# Patient Record
Sex: Female | Born: 1995 | Race: Black or African American | Hispanic: No | Marital: Single | Smoking: Former smoker
Health system: Southern US, Community
[De-identification: ages and names within clinical notes are randomized; demographics above are authoritative.]

## PROBLEM LIST (undated history)

## (undated) DIAGNOSIS — Z789 Other specified health status: Secondary | ICD-10-CM

## (undated) DIAGNOSIS — F32A Depression, unspecified: Secondary | ICD-10-CM

## (undated) DIAGNOSIS — K219 Gastro-esophageal reflux disease without esophagitis: Secondary | ICD-10-CM

## (undated) DIAGNOSIS — R519 Headache, unspecified: Secondary | ICD-10-CM

## (undated) DIAGNOSIS — F419 Anxiety disorder, unspecified: Secondary | ICD-10-CM

## (undated) HISTORY — PX: NO PAST SURGERIES: SHX2092

## (undated) HISTORY — DX: Other specified health status: Z78.9

---
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
## 2017-09-14 ENCOUNTER — Other Ambulatory Visit: Payer: Self-pay

## 2017-09-14 ENCOUNTER — Emergency Department
Admission: EM | Admit: 2017-09-14 | Discharge: 2017-09-14 | Disposition: A | Discharge status: Home / Self Care | Payer: Self-pay | Attending: Emergency Medicine | Admitting: Emergency Medicine

## 2017-09-14 ENCOUNTER — Encounter: Payer: Self-pay | Admitting: *Deleted

## 2017-09-14 DIAGNOSIS — R1084 Generalized abdominal pain: Secondary | ICD-10-CM | POA: Insufficient documentation

## 2017-09-14 DIAGNOSIS — K529 Noninfective gastroenteritis and colitis, unspecified: Secondary | ICD-10-CM

## 2017-09-14 DIAGNOSIS — R112 Nausea with vomiting, unspecified: Secondary | ICD-10-CM

## 2017-09-14 DIAGNOSIS — F17200 Nicotine dependence, unspecified, uncomplicated: Secondary | ICD-10-CM | POA: Insufficient documentation

## 2017-09-14 LAB — PREGNANCY, URINE: Preg Test, Ur: NEGATIVE

## 2017-09-14 LAB — URINALYSIS, COMPLETE (UACMP) WITH MICROSCOPIC
Bilirubin Urine: NEGATIVE
GLUCOSE, UA: NEGATIVE mg/dL
KETONES UR: NEGATIVE mg/dL
Leukocytes, UA: NEGATIVE
Nitrite: NEGATIVE
PH: 6 (ref 5.0–8.0)
Protein, ur: NEGATIVE mg/dL
Specific Gravity, Urine: 1.017 (ref 1.005–1.030)

## 2017-09-14 LAB — COMPREHENSIVE METABOLIC PANEL
ALK PHOS: 66 U/L (ref 38–126)
ALT: 18 U/L (ref 14–54)
AST: 27 U/L (ref 15–41)
Albumin: 4.1 g/dL (ref 3.5–5.0)
Anion gap: 9 (ref 5–15)
BILIRUBIN TOTAL: 0.9 mg/dL (ref 0.3–1.2)
BUN: 12 mg/dL (ref 6–20)
CALCIUM: 9.2 mg/dL (ref 8.9–10.3)
CO2: 26 mmol/L (ref 22–32)
CREATININE: 0.91 mg/dL (ref 0.44–1.00)
Chloride: 106 mmol/L (ref 101–111)
GFR calc Af Amer: >60 mL/min (ref >60)
Glucose, Bld: 90 mg/dL (ref 65–99)
Potassium: 3.7 mmol/L (ref 3.5–5.1)
Sodium: 141 mmol/L (ref 135–145)
TOTAL PROTEIN: 7.9 g/dL (ref 6.5–8.1)

## 2017-09-14 LAB — LIPASE, BLOOD: Lipase: 18 U/L (ref 11–51)

## 2017-09-14 LAB — CBC
HCT: 38.2 % (ref 35.0–47.0)
Hemoglobin: 12.7 g/dL (ref 12.0–16.0)
MCH: 30.7 pg (ref 26.0–34.0)
MCHC: 33.2 g/dL (ref 32.0–36.0)
MCV: 92.4 fL (ref 80.0–100.0)
PLATELETS: 305 10*3/uL (ref 150–440)
RBC: 4.14 MIL/uL (ref 3.80–5.20)
RDW: 13.7 % (ref 11.5–14.5)
WBC: 13.4 10*3/uL — AB (ref 3.6–11.0)

## 2017-09-14 LAB — POCT PREGNANCY, URINE: Preg Test, Ur: NEGATIVE

## 2017-09-14 MED ORDER — ONDANSETRON 4 MG PO TBDP
ORAL_TABLET | ORAL | Status: AC
  Filled 2017-09-14: qty 1

## 2017-09-14 MED ORDER — ONDANSETRON HCL 4 MG PO TABS: 4.0000 mg | ORAL_TABLET | Freq: Three times a day (TID) | ORAL | 0 refills | Status: DC | PRN

## 2017-09-14 MED ORDER — DICYCLOMINE HCL 20 MG PO TABS: 20.0000 mg | ORAL_TABLET | Freq: Three times a day (TID) | ORAL | 0 refills | Status: DC | PRN

## 2017-09-14 MED ORDER — ONDANSETRON 4 MG PO TBDP
4.0000 mg | ORAL_TABLET | Freq: Once | ORAL | Status: AC | PRN
  Administered 2017-09-14: 4 mg via ORAL

## 2017-09-14 NOTE — ED Triage Notes (Signed)
Patient report vomiting began yesterday and continues today. Patient reports vomiting bile-colored emesis.

## 2017-09-14 NOTE — ED Provider Notes (Signed)
[facility] Emergency Department Provider Note  ____________________________________________   I have reviewed the triage vital signs and the nursing notes.   HISTORY  Chief Complaint Emesis and Abdominal Pain   History limited by: Not Limited   HPI Alyssa Woodard is a 22 y.o. female who presents to the emergency department today with concerns for abdominal pain nausea and vomiting.  The patient states the symptoms started last night.  They started shortly after she ate a couple hotdogs.  She describes the pain as being generalized throughout her abdomen.  It is sharp.  It does come and go.  In addition she has had multiple episodes of emesis.  She describes some green emesis.  She denies any diarrhea.  Denies any fevers.  At the time my examination she does states she feels much improved.  She did receive Zofran in triage.  History reviewed. No pertinent past medical history.  There are no active problems to display for this patient.   History reviewed. No pertinent surgical history.  Prior to Admission medications   Not on File    Allergies Patient has no known allergies.  No family history on file.  Social History Social History   Tobacco Use  . Smoking status: Current Some Day Smoker  . Smokeless tobacco: Never Used  Substance Use Topics  . Alcohol use: Yes    Comment: socially  . Drug use: Never    Review of Systems Constitutional: No fever/chills Eyes: No visual changes. ENT: No sore throat. Cardiovascular: Denies chest pain. Respiratory: Denies shortness of breath. Gastrointestinal: Generalized abdominal pain and nausea with vomiting. Genitourinary: Negative for dysuria. Musculoskeletal: Negative for back pain. Skin: Negative for rash. Neurological: Negative for headaches, focal weakness or numbness.  ____________________________________________   PHYSICAL EXAM:  VITAL SIGNS: ED Triage Vitals  Enc Vitals Group     BP  09/14/17 1515 (!) 146/76     Pulse Rate 09/14/17 1515 (!) 106     Resp 09/14/17 1515 18     Temp 09/14/17 1515 98.9 F (37.2 C)     Temp Source 09/14/17 1515 Oral     SpO2 09/14/17 1515 99 %     Weight 09/14/17 1517 (!) 408 lb 15.3 oz (185.5 kg)     Height 09/14/17 1517 5\' 1"  (1.549 m)     Head Circumference --      Peak Flow --      Pain Score 09/14/17 1517 8    Constitutional: Alert and oriented. Well appearing and in no distress. Eyes: Conjunctivae are normal.  ENT   Head: Normocephalic and atraumatic.   Nose: No congestion/rhinnorhea.   Mouth/Throat: Mucous membranes are moist.   Neck: No stridor. Hematological/Lymphatic/Immunilogical: No cervical lymphadenopathy. Cardiovascular: Normal rate, regular rhythm.  No murmurs, rubs, or gallops appreciated, exam somewhat limited to body habitus.  Respiratory: Normal respiratory effort without tachypnea nor retractions. Auscultation limited due to body habitus. Gastrointestinal: Soft and somewhat diffusely tender to palpation. No rebound. No guarding.  Genitourinary: Deferred Musculoskeletal: Normal range of motion in all extremities. No lower extremity edema. Neurologic:  Normal speech and language. No gross focal neurologic deficits are appreciated.  Skin:  Skin is warm, dry and intact. No rash noted. Psychiatric: Mood and affect are normal. Speech and behavior are normal. Patient exhibits appropriate insight and judgment.  ____________________________________________    LABS (pertinent positives/negatives)  Upreg negative Lipase 18 CMP wnl CBC wbc 13.4, hgb 12.7, plt 305 UA cloudy, leukocytes negative, 6-30 wbcs, rare  bacteria 6-30 squamous  ____________________________________________   EKG  None  ____________________________________________    RADIOLOGY  None  ____________________________________________   PROCEDURES  Procedures  ____________________________________________   INITIAL  IMPRESSION / ASSESSMENT AND PLAN / ED COURSE  Pertinent labs & imaging results that were available during my care of the patient were reviewed by me and considered in my medical decision making (see chart for details).  Patient presented to the emergency department today because of concerns for abdominal pain, nausea and vomiting.  At time of exam she is feeling better after Zofran.  Blood work with very mild leukocytosis otherwise unremarkable.  Urine with some white blood cells however patient not complaining of any dysuria.  At this point I do think gastroenteritis versus food poisoning most likely.  I doubt gallbladder, appendicitis, diverticulitis, volvulus or other significant intra-abdominal pathology.  I did offer to give patient further medications to try to alleviate some of her abdominal pain however she declined stating she would rather go home.  Will give patient prescriptions for gastroenteritis. Discussed findings and plan with patient.  ____________________________________________   FINAL CLINICAL IMPRESSION(S) / ED DIAGNOSES  Final diagnoses:  Gastroenteritis  Nausea and vomiting, intractability of vomiting not specified, unspecified vomiting type     Note: This dictation was prepared with Dragon dictation. Any transcriptional errors that result from this process are unintentional    Phineas Semen, MD 09/14/17 1732

## 2017-09-14 NOTE — Discharge Instructions (Addendum)
Please seek medical attention for any high fevers, chest pain, shortness of breath, change in behavior, persistent vomiting, bloody stool or any other new or concerning symptoms.  

## 2017-09-16 LAB — URINE CULTURE

## 2019-04-30 ENCOUNTER — Encounter (HOSPITAL_COMMUNITY): Payer: Self-pay

## 2019-04-30 ENCOUNTER — Ambulatory Visit (HOSPITAL_COMMUNITY)
Admission: EM | Admit: 2019-04-30 | Discharge: 2019-04-30 | Disposition: A | Discharge status: Home / Self Care | Payer: Medicaid Other | Attending: Internal Medicine | Admitting: Internal Medicine

## 2019-04-30 ENCOUNTER — Other Ambulatory Visit: Payer: Self-pay

## 2019-04-30 DIAGNOSIS — Z3201 Encounter for pregnancy test, result positive: Secondary | ICD-10-CM

## 2019-04-30 DIAGNOSIS — Z3491 Encounter for supervision of normal pregnancy, unspecified, first trimester: Secondary | ICD-10-CM

## 2019-04-30 DIAGNOSIS — R1030 Lower abdominal pain, unspecified: Secondary | ICD-10-CM | POA: Diagnosis not present

## 2019-04-30 LAB — POC URINE PREG, ED: Preg Test, Ur: POSITIVE — AB

## 2019-04-30 LAB — POCT PREGNANCY, URINE: Preg Test, Ur: POSITIVE — AB

## 2019-04-30 MED ORDER — PRENATAL COMPLETE 14-0.4 MG PO TABS: 1.0000 | ORAL_TABLET | Freq: Every day | ORAL | 0 refills | Status: DC

## 2019-04-30 NOTE — Discharge Instructions (Signed)
Positive pregnancy test today. Based on a last period of 03/20/2019 you are approximately 5 weeks 6 days pregnant with an estimated due date of 12/25/2019.  Please start a daily prenatal vitamin.  Avoid alcohol or tobacco products.  Please call to establish care with an ob, typically first appointments are anywhere from 8-12 weeks.  See provided list of medications which are safe during pregnancy.  See provided information about nausea and pregnancy.  Please go to [facility] hospital for any abdominal pain and/or vaginal bleeding.

## 2019-04-30 NOTE — ED Provider Notes (Signed)
[facility]    CSN: [id number] Arrival date & time: 04/30/19  7001      History   Chief Complaint Chief Complaint  Patient presents with  . pregnancy test    HPI Alyssa Woodard is a 23 y.o. female.   Alyssa Woodard presents with requests for pregnancy validation. Took a home pregnancy test which was positive. Some intermittent low abd cramping. No vaginal bleeding. LMP 10/10. No previous pregnancies. Doesn't smoke. Doesn't take any medications. No complaints today. Without contributing medical history.       ROS per HPI, negative if not otherwise mentioned.      History reviewed. No pertinent past medical history.  There are no active problems to display for this patient.   History reviewed. No pertinent surgical history.  OB History   No obstetric history on file.      Home Medications    Prior to Admission medications   Medication Sig Start Date End Date Taking? Authorizing Provider  dicyclomine (BENTYL) 20 MG tablet Take 1 tablet (20 mg total) by mouth 3 (three) times daily as needed (abdominal pain). 09/14/17   Phineas Semen, MD  ondansetron (ZOFRAN) 4 MG tablet Take 1 tablet (4 mg total) by mouth every 8 (eight) hours as needed for nausea or vomiting. 09/14/17   Phineas Semen, MD  Prenatal Vit-Fe Fumarate-FA (PRENATAL COMPLETE) 14-0.4 MG TABS Take 1 tablet by mouth daily. 04/30/19   Georgetta Haber, NP    Family History Family History  Problem Relation Age of Onset  . Healthy Mother   . Healthy Father     Social History Social History   Tobacco Use  . Smoking status: Former Smoker    Types: Cigarettes  . Smokeless tobacco: Never Used  . Tobacco comment: none since 04/22/2019  Substance Use Topics  . Alcohol use: Not Currently    Comment: socially  . Drug use: Not Currently     Allergies   Patient has no known allergies.   Review of Systems Review of Systems   Physical Exam Triage Vital Signs ED Triage Vitals   Enc Vitals Group     BP 04/30/19 1046 128/74     Pulse Rate 04/30/19 1046 77     Resp 04/30/19 1046 18     Temp 04/30/19 1046 98.1 F (36.7 C)     Temp Source 04/30/19 1046 Oral     SpO2 04/30/19 1046 99 %     Weight --      Height --      Head Circumference --      Peak Flow --      Pain Score 04/30/19 1051 0     Pain Loc --      Pain Edu? --      Excl. in GC? --    No data found.  Updated Vital Signs BP 128/74 (BP Location: Left Arm)   Pulse 77   Temp 98.1 F (36.7 C) (Oral)   Resp 18   LMP 03/20/2019   SpO2 99%   Visual Acuity Right Eye Distance:   Left Eye Distance:   Bilateral Distance:    Right Eye Near:   Left Eye Near:    Bilateral Near:     Physical Exam Constitutional:      General: She is not in acute distress.    Appearance: She is well-developed.  Cardiovascular:     Rate and Rhythm: Normal rate.  Pulmonary:     Effort: Pulmonary  effort is normal.  Skin:    General: Skin is warm and dry.  Neurological:     Mental Status: She is alert and oriented to person, place, and time.      UC Treatments / Results  Labs (all labs ordered are listed, but only abnormal results are displayed) Labs Reviewed  POCT PREGNANCY, URINE - Abnormal; Notable for the following components:      Result Value   Preg Test, Ur POSITIVE (*)    All other components within normal limits  POC URINE PREG, ED - Abnormal; Notable for the following components:   Preg Test, Ur POSITIVE (*)    All other components within normal limits    EKG   Radiology No results found.  Procedures Procedures (including critical care time)  Medications Ordered in UC Medications - No data to display  Initial Impression / Assessment and Plan / UC Course  I have reviewed the triage vital signs and the nursing notes.  Pertinent labs & imaging results that were available during my care of the patient were reviewed by me and considered in my medical decision making (see chart for  details).     Positive pregnancy test by urine here today. Approximately 5 week 6 days per LMP. Prenatals prescribed, initial prenatal recommendations discussed. Encouraged establish with OB. Patient verbalized understanding and agreeable to plan.    Final Clinical Impressions(s) / UC Diagnoses   Final diagnoses:  First trimester pregnancy  Positive pregnancy test     Discharge Instructions     Positive pregnancy test today. Based on a last period of 03/20/2019 you are approximately 5 weeks 6 days pregnant with an estimated due date of 12/25/2019.  Please start a daily prenatal vitamin.  Avoid alcohol or tobacco products.  Please call to establish care with an ob, typically first appointments are anywhere from 8-12 weeks.  See provided list of medications which are safe during pregnancy.  See provided information about nausea and pregnancy.  Please go to [facility] hospital for any abdominal pain and/or vaginal bleeding.    ED Prescriptions    Medication Sig Dispense Auth. Provider   Prenatal Vit-Fe Fumarate-FA (PRENATAL COMPLETE) 14-0.4 MG TABS Take 1 tablet by mouth daily. 60 tablet Zigmund Gottron, NP     PDMP not reviewed this encounter.   Zigmund Gottron, NP 04/30/19 1218

## 2019-04-30 NOTE — ED Triage Notes (Signed)
Pt presents to UC stating she took a pregnancy test on 11/12 which seemed positive. Pt states she took the test d/t feeling nauseous. She would like a confirmation test here.

## 2019-05-24 ENCOUNTER — Other Ambulatory Visit (HOSPITAL_COMMUNITY): Payer: Self-pay | Admitting: Nurse Practitioner

## 2019-05-24 DIAGNOSIS — Z369 Encounter for antenatal screening, unspecified: Secondary | ICD-10-CM

## 2019-05-24 DIAGNOSIS — Z3A13 13 weeks gestation of pregnancy: Secondary | ICD-10-CM

## 2019-05-24 LAB — OB RESULTS CONSOLE RUBELLA ANTIBODY, IGM: Rubella: IMMUNE

## 2019-05-24 LAB — OB RESULTS CONSOLE GC/CHLAMYDIA
Chlamydia: NEGATIVE
Gonorrhea: NEGATIVE

## 2019-05-24 LAB — OB RESULTS CONSOLE HIV ANTIBODY (ROUTINE TESTING): HIV: NONREACTIVE

## 2019-05-24 LAB — OB RESULTS CONSOLE HEPATITIS B SURFACE ANTIGEN: Hepatitis B Surface Ag: NEGATIVE

## 2019-05-24 LAB — OB RESULTS CONSOLE ABO/RH: RH Type: POSITIVE

## 2019-05-24 LAB — OB RESULTS CONSOLE RPR: RPR: NONREACTIVE

## 2019-05-24 LAB — OB RESULTS CONSOLE ANTIBODY SCREEN: Antibody Screen: NEGATIVE

## 2019-06-16 ENCOUNTER — Encounter (HOSPITAL_COMMUNITY): Payer: Self-pay | Admitting: *Deleted

## 2019-06-21 ENCOUNTER — Ambulatory Visit (HOSPITAL_COMMUNITY)
Admission: RE | Admit: 2019-06-21 | Discharge: 2019-06-21 | Disposition: A | Discharge status: Home / Self Care | Payer: Medicaid Other | Source: Ambulatory Visit | Attending: Obstetrics and Gynecology | Admitting: Obstetrics and Gynecology

## 2019-06-21 ENCOUNTER — Other Ambulatory Visit: Payer: Self-pay

## 2019-06-21 ENCOUNTER — Ambulatory Visit (HOSPITAL_BASED_OUTPATIENT_CLINIC_OR_DEPARTMENT_OTHER): Payer: Medicaid Other | Admitting: Genetic Counselor

## 2019-06-21 ENCOUNTER — Ambulatory Visit (HOSPITAL_COMMUNITY): Payer: Medicaid Other | Admitting: *Deleted

## 2019-06-21 ENCOUNTER — Encounter (HOSPITAL_COMMUNITY): Payer: Self-pay

## 2019-06-21 ENCOUNTER — Other Ambulatory Visit (HOSPITAL_COMMUNITY): Payer: Self-pay | Admitting: *Deleted

## 2019-06-21 ENCOUNTER — Other Ambulatory Visit (HOSPITAL_COMMUNITY): Payer: Self-pay | Admitting: Nurse Practitioner

## 2019-06-21 ENCOUNTER — Ambulatory Visit (HOSPITAL_COMMUNITY): Payer: Self-pay | Admitting: Genetic Counselor

## 2019-06-21 VITALS — BP 113/60 | HR 88 | Temp 96.6°F | Wt >= 6400 oz

## 2019-06-21 DIAGNOSIS — Z3682 Encounter for antenatal screening for nuchal translucency: Secondary | ICD-10-CM | POA: Diagnosis not present

## 2019-06-21 DIAGNOSIS — O99212 Obesity complicating pregnancy, second trimester: Secondary | ICD-10-CM | POA: Insufficient documentation

## 2019-06-21 DIAGNOSIS — Z369 Encounter for antenatal screening, unspecified: Secondary | ICD-10-CM

## 2019-06-21 DIAGNOSIS — Z363 Encounter for antenatal screening for malformations: Secondary | ICD-10-CM | POA: Diagnosis not present

## 2019-06-21 DIAGNOSIS — Z3A15 15 weeks gestation of pregnancy: Secondary | ICD-10-CM | POA: Insufficient documentation

## 2019-06-21 DIAGNOSIS — Z3A13 13 weeks gestation of pregnancy: Secondary | ICD-10-CM

## 2019-06-21 DIAGNOSIS — F819 Developmental disorder of scholastic skills, unspecified: Secondary | ICD-10-CM

## 2019-06-21 DIAGNOSIS — Z36 Encounter for antenatal screening for chromosomal anomalies: Secondary | ICD-10-CM | POA: Insufficient documentation

## 2019-06-21 DIAGNOSIS — E669 Obesity, unspecified: Secondary | ICD-10-CM | POA: Diagnosis not present

## 2019-06-21 DIAGNOSIS — O0990 Supervision of high risk pregnancy, unspecified, unspecified trimester: Secondary | ICD-10-CM

## 2019-06-21 DIAGNOSIS — O99342 Other mental disorders complicating pregnancy, second trimester: Secondary | ICD-10-CM | POA: Diagnosis not present

## 2019-06-21 DIAGNOSIS — Z315 Encounter for genetic counseling: Secondary | ICD-10-CM | POA: Diagnosis not present

## 2019-06-21 NOTE — Progress Notes (Signed)
06/21/2019  Lamisha N Dipiero [birth year] MRN: [id number] DOV: 06/21/2019  Ms. Sanzo presented to the [facility] for a genetics consultation regarding screening for chromosomal aneuploidies. Ms. Ergle came to her appointment alone due to COVID-19 visitor restrictions. However, her mother participated in the session via FaceTime.   Indication for genetic counseling - Screening for chromosomal aneuploidies  Prenatal history  Ms. Willmon is a G75P0, 24 y.o. female. Her current pregnancy has completed [redacted]w[redacted]d (Estimated Date of Delivery: 12/08/19).  Ms. Crist denied exposure to environmental toxins or chemical agents. She denied the use of alcohol or street drugs. She was a former smoker but quit smoking cigarettes on 04/22/19. She reported taking prenatal vitamins and Tylenol. She denied significant viral illnesses, fevers, and bleeding during the course of her pregnancy. Her medical and surgical histories were noncontributory.  Family History  A three generation pedigree was drafted and reviewed. The family history is remarkable for the following:  - Ms. Youtsey has a personal history of a learning disability. She was pulled out of classes to receive services. She graduated high school but is not employed. Per her mother, she was not delayed in achieving any of her speech or motor milestones. Ms. Torosian also has a maternal half brother who is 61 years old and does not speak. He communicates via grunting. He reportedly has a learning disability and autism. Ms. Kleeman brother also has a paternal uncle with autism, who is not related to Ms. Loscalzo by blood. Ms. Sherbert mother also reported a personal history of learning difficulties in school. She got pulled out of classes for services. We discussed that many times, learning difficulties and developmental delays are multifactorial in nature, occurring due to a combination of genetic and environmental factors that are difficult  to identify. Learning disabilities and developmental delays can also be associated with genetic conditions. Without further information, precise risk assessment is limited; however, learning disabilities and developmental delays can appear to run in families. Thus, there is a chance that Ms. Grave's children could also experience learning difficulties of some kind. She understands that she should make the pediatrician aware of any concerns she has about her children's development.  - Ms. Baisch's partner, Craig Staggers, was born with a congenital heart defect (CHD) that required surgery at age 40. Details about the etiology of this CHD are not available; however, Ms. Alia mother is going to discuss this with Mr. Durenda Age mother in greater detail. There are multifactorial causes for isolated CHDs, including environmental and genetic factors. As such, the recurrence risk for first degree relatives is elevated over the general population incidence of 1/200 (0.5%). We discussed that without knowing the exact etiology, the risk for a child of an affected father is approximately 2-3%. A fetal echocardiogram to assess for CHDs in the current pregnancy is recommended.  The remaining family histories were reviewed and found to be noncontributory for birth defects, intellectual disability, recurrent pregnancy loss, and known genetic conditions. Ms. Galgano had limited information about her partner's family history; thus, risk assessment was limited.  The patient's ethnicity is African American. The father of the pregnancy's ethnicity is African American. Ashkenazi Jewish ancestry and consanguinity were denied. Pedigree will be scanned under Media.  Discussion  Ms. Gotschall was referred to genetic counseling as she was interested in discussing aneuploidy screening options for the current pregnancy. We reviewed noninvasive prenatal screening (NIPS) as an available screening option. Specifically, we discussed that  NIPS analyzes cell free DNA  originating from the placenta that is found in the maternal blood circulation during pregnancy. This test can provide information regarding the presence or absence of extra fetal DNA for chromosomes 13, 18, 21, and the sex chromosomes. Thus, it would not identify or rule out all fetal aneuploidy. The reported detection rate is 91-99% for trisomies 21, 18, 13, and sex chromosome aneuploidies. The false positive rate is reported to be less than 0.1% for any of these conditions.   We discussed that there are several different types of results we could receive from NIPS. The first is a negative, or normal result. This indicates that the fetus has a reduced risk of being affected by trisomy 69 (Down syndrome), trisomy 37, trisomy 95, or sex chromosome aneuploidies. This is the most likely result given Ms. Grave's age. The second type is a positive result. This indicates that there is a chance that the fetus could have a chromosomal aneuploidy. The third type is an unexpected result. For example, sometimes NIPS fails due to low fetal fraction. The term "fetal fraction" refers to the amount of sample that is believed to have come from fetal DNA rather than maternal DNA. We reviewed that there are many possible reasons a sample may have a low fetal fraction, including early gestational age, high maternal BMI, suboptimal sample collection, maternal use of medications like low molecular weight heparin, pregnancy loss, pregnancy complications, fetal aneuploidy, and normal variation. If we were to receive a no-call result from NIPS, the test could potentially be repeated.  We also reviewed limitations of NIPS. Firstly, NIPS cannot screen for all possible genetic conditions or disabilities that a child could potentially have. Secondly, NIPS is NOT diagnostic. While this testing identifies the majority of pregnancies with trisomy 45, trisomy 16, trisomy 6, and sex chromosome aneuploidies, a  positive test result requires confirmation by CVS or amniocentesis, and a negative test result does not rule out a fetal chromosome abnormality. Ms. Neuberger indicated that she is interested in undergoing NIPS. She indicated that she would like to know ahead of time if her baby were expected to have any problems that could affect their health or development.  A limited early ultrasound was performed today prior to our visit. The ultrasound report will be sent under separate cover. There were no visualized fetal anomalies or markers suggestive of aneuploidy. Nuchal translucency measurement could not be obtained due to Ms. Hollomon's gestational age being more advanced than anticipated.  Ms. Joswick was also counseled regarding diagnostic testing via amniocentesis. We discussed the technical aspects of the procedure and quoted up to a 1 in 500 (0.2%) risk for spontaneous pregnancy loss or other adverse pregnancy outcomes as a result of amniocentesis. Cultured cells from an amniocentesis sample allow for the visualization of a fetal karyotype, which can detect >99% of chromosomal aberrations. Chromosomal microarray can also be performed to identify smaller deletions or duplications of fetal chromosomal material. After careful consideration, Ms. Heinkel declined amniocentesis. She understands that amniocentesis is available at any point after 16 weeks of pregnancy and that she may opt to undergo the procedure at a later date should she change her mind.  Lastly, screening for open neural tube defects (ONTDs) via MS-AFP in the second trimester in addition to level II ultrasound examination is recommended. Level II ultrasound and MS-AFP are able to detect ONTDs with 90-95% sensitivity. However, normal results from any of the above options do not guarantee a normal baby, as 3-5% of newborns have some type of birth  defect, many of which are not prenatally diagnosable.  Ms. Fuster had her blood drawn for MaterniT21 NIPS  today. Results will take 5-7 days to be returned. I will call Ms. Butch when her results become available.  I counseled Ms. Murty regarding the above risks and available options. The approximate face-to-face time with the genetic counselor was 30 minutes.  In summary:  Discussed noninvasive prenatal screening (NIPS) for fetal aneuploidies  Opted to undergo MaterniT21 NIPS. We will follow results  Reviewed results of ultrasound  No fetal anomalies or markers seen  Offered additional testing and screening  Declined diagnostic testing  Reviewed family history concerns  Recommend fetal echo due to FOB's history of a congenital heart defect   Gershon Crane, MS Genetic Counselor

## 2019-06-27 LAB — MATERNIT21 PLUS CORE+SCA
Fetal Fraction: 10
Monosomy X (Turner Syndrome): NOT DETECTED
Result (T21): NEGATIVE
Trisomy 13 (Patau syndrome): NEGATIVE
Trisomy 18 (Edwards syndrome): NEGATIVE
Trisomy 21 (Down syndrome): NEGATIVE
XXX (Triple X Syndrome): NOT DETECTED
XXY (Klinefelter Syndrome): NOT DETECTED
XYY (Jacobs Syndrome): NOT DETECTED

## 2019-06-29 ENCOUNTER — Telehealth (HOSPITAL_COMMUNITY): Payer: Self-pay | Admitting: Genetic Counselor

## 2019-06-29 NOTE — Telephone Encounter (Signed)
I called Ms. Menna and her mother to discuss her negative noninvasive prenatal screening (NIPS)/cell free DNA (cfDNA) testing result. Specifically, Ms. Yerby had MaterniT21 testing through [facility]. These negative results demonstrated an expected representation of chromosome 68, 35, 39, and sex chromosome material, greatly reducing the likelihood of trisomies 12, 47, or 100 and sex chromosome aneuploidies for the pregnancy. Ms. Devins did not want to know the expected fetal sex; however, she requested that I tell her mother as they are planning a gender reveal party. Expected fetal sex is female.  NIPS analyzes placental (fetal) DNA in maternal circulation. NIPS is considered to be highly specific and sensitive, but is not considered to be a diagnostic test. We reviewed that this testing identifies the majority (91-99%) of pregnancies with trisomies 54, 40, and 59, as well as sex chromosome aneuploidies. However, it cannot rule out all possible genetic conditions or disabilities. Diagnostic testing via amniocentesis is available should she be interested in confirming this result. Ms. Kinderman and her mother confirmed that they had no questions about these results at this time.  Gershon Crane, MS Genetic Counselor

## 2019-07-26 ENCOUNTER — Ambulatory Visit (HOSPITAL_COMMUNITY): Payer: Medicaid Other | Admitting: *Deleted

## 2019-07-26 ENCOUNTER — Other Ambulatory Visit (HOSPITAL_COMMUNITY): Payer: Self-pay | Admitting: *Deleted

## 2019-07-26 ENCOUNTER — Encounter (HOSPITAL_COMMUNITY): Payer: Self-pay

## 2019-07-26 ENCOUNTER — Other Ambulatory Visit: Payer: Self-pay

## 2019-07-26 ENCOUNTER — Ambulatory Visit (HOSPITAL_COMMUNITY)
Admission: RE | Admit: 2019-07-26 | Discharge: 2019-07-26 | Disposition: A | Discharge status: Home / Self Care | Payer: Medicaid Other | Source: Ambulatory Visit | Attending: Obstetrics and Gynecology | Admitting: Obstetrics and Gynecology

## 2019-07-26 VITALS — BP 134/66 | HR 102 | Temp 98.6°F

## 2019-07-26 DIAGNOSIS — Z3A20 20 weeks gestation of pregnancy: Secondary | ICD-10-CM

## 2019-07-26 DIAGNOSIS — Z6841 Body Mass Index (BMI) 40.0 and over, adult: Secondary | ICD-10-CM

## 2019-07-26 DIAGNOSIS — O99212 Obesity complicating pregnancy, second trimester: Secondary | ICD-10-CM

## 2019-08-10 ENCOUNTER — Encounter: Payer: Self-pay | Admitting: Pediatrics

## 2019-08-23 ENCOUNTER — Ambulatory Visit (HOSPITAL_COMMUNITY)
Admission: RE | Admit: 2019-08-23 | Discharge: 2019-08-23 | Disposition: A | Discharge status: Home / Self Care | Payer: Medicaid Other | Source: Ambulatory Visit | Attending: Obstetrics and Gynecology | Admitting: Obstetrics and Gynecology

## 2019-08-23 ENCOUNTER — Other Ambulatory Visit (HOSPITAL_COMMUNITY): Payer: Self-pay | Admitting: *Deleted

## 2019-08-23 ENCOUNTER — Ambulatory Visit (HOSPITAL_COMMUNITY): Payer: Medicaid Other | Admitting: *Deleted

## 2019-08-23 ENCOUNTER — Encounter (HOSPITAL_COMMUNITY): Payer: Self-pay

## 2019-08-23 ENCOUNTER — Other Ambulatory Visit: Payer: Self-pay

## 2019-08-23 VITALS — BP 112/68 | HR 113 | Temp 98.8°F

## 2019-08-23 DIAGNOSIS — Z3A24 24 weeks gestation of pregnancy: Secondary | ICD-10-CM | POA: Insufficient documentation

## 2019-08-23 DIAGNOSIS — Z362 Encounter for other antenatal screening follow-up: Secondary | ICD-10-CM | POA: Diagnosis present

## 2019-08-23 DIAGNOSIS — O99212 Obesity complicating pregnancy, second trimester: Secondary | ICD-10-CM | POA: Diagnosis not present

## 2019-08-23 DIAGNOSIS — Z6841 Body Mass Index (BMI) 40.0 and over, adult: Secondary | ICD-10-CM | POA: Diagnosis not present

## 2019-08-23 DIAGNOSIS — Z8279 Family history of other congenital malformations, deformations and chromosomal abnormalities: Secondary | ICD-10-CM | POA: Diagnosis not present

## 2019-09-20 ENCOUNTER — Ambulatory Visit (HOSPITAL_COMMUNITY)
Admission: RE | Admit: 2019-09-20 | Discharge: 2019-09-20 | Disposition: A | Discharge status: Home / Self Care | Payer: Medicaid Other | Source: Ambulatory Visit | Attending: Obstetrics and Gynecology | Admitting: Obstetrics and Gynecology

## 2019-09-20 ENCOUNTER — Ambulatory Visit (HOSPITAL_COMMUNITY): Payer: Medicaid Other | Admitting: *Deleted

## 2019-09-20 ENCOUNTER — Encounter (HOSPITAL_COMMUNITY): Payer: Self-pay

## 2019-09-20 ENCOUNTER — Other Ambulatory Visit: Payer: Self-pay

## 2019-09-20 ENCOUNTER — Other Ambulatory Visit (HOSPITAL_COMMUNITY): Payer: Self-pay | Admitting: *Deleted

## 2019-09-20 VITALS — BP 118/76 | HR 113 | Temp 97.0°F

## 2019-09-20 DIAGNOSIS — O328XX0 Maternal care for other malpresentation of fetus, not applicable or unspecified: Secondary | ICD-10-CM | POA: Diagnosis not present

## 2019-09-20 DIAGNOSIS — O99213 Obesity complicating pregnancy, third trimester: Secondary | ICD-10-CM | POA: Diagnosis present

## 2019-09-20 DIAGNOSIS — Z8279 Family history of other congenital malformations, deformations and chromosomal abnormalities: Secondary | ICD-10-CM | POA: Insufficient documentation

## 2019-09-20 DIAGNOSIS — Z6841 Body Mass Index (BMI) 40.0 and over, adult: Secondary | ICD-10-CM

## 2019-09-20 DIAGNOSIS — Z362 Encounter for other antenatal screening follow-up: Secondary | ICD-10-CM | POA: Diagnosis not present

## 2019-09-20 DIAGNOSIS — Z3A28 28 weeks gestation of pregnancy: Secondary | ICD-10-CM | POA: Insufficient documentation

## 2019-09-20 DIAGNOSIS — O0990 Supervision of high risk pregnancy, unspecified, unspecified trimester: Secondary | ICD-10-CM

## 2019-10-20 ENCOUNTER — Other Ambulatory Visit: Payer: Self-pay | Admitting: *Deleted

## 2019-10-20 ENCOUNTER — Other Ambulatory Visit: Payer: Self-pay

## 2019-10-20 ENCOUNTER — Ambulatory Visit: Payer: Medicaid Other | Admitting: *Deleted

## 2019-10-20 ENCOUNTER — Ambulatory Visit (HOSPITAL_COMMUNITY): Payer: Medicaid Other | Attending: Obstetrics and Gynecology

## 2019-10-20 VITALS — BP 134/81 | HR 102 | Temp 98.1°F

## 2019-10-20 DIAGNOSIS — O99213 Obesity complicating pregnancy, third trimester: Secondary | ICD-10-CM

## 2019-10-20 DIAGNOSIS — Z3A33 33 weeks gestation of pregnancy: Secondary | ICD-10-CM

## 2019-10-20 DIAGNOSIS — Z8279 Family history of other congenital malformations, deformations and chromosomal abnormalities: Secondary | ICD-10-CM

## 2019-10-20 DIAGNOSIS — Z362 Encounter for other antenatal screening follow-up: Secondary | ICD-10-CM | POA: Diagnosis not present

## 2019-10-20 DIAGNOSIS — O99210 Obesity complicating pregnancy, unspecified trimester: Secondary | ICD-10-CM

## 2019-10-20 DIAGNOSIS — Z6841 Body Mass Index (BMI) 40.0 and over, adult: Secondary | ICD-10-CM

## 2019-11-11 LAB — OB RESULTS CONSOLE GBS: GBS: NEGATIVE

## 2019-11-17 ENCOUNTER — Other Ambulatory Visit: Payer: Self-pay

## 2019-11-17 ENCOUNTER — Ambulatory Visit: Payer: Medicaid Other | Attending: Obstetrics and Gynecology

## 2019-11-17 ENCOUNTER — Ambulatory Visit: Payer: Medicaid Other | Admitting: *Deleted

## 2019-11-17 VITALS — BP 127/72 | HR 113

## 2019-11-17 DIAGNOSIS — O99213 Obesity complicating pregnancy, third trimester: Secondary | ICD-10-CM | POA: Diagnosis not present

## 2019-11-17 DIAGNOSIS — Z362 Encounter for other antenatal screening follow-up: Secondary | ICD-10-CM

## 2019-11-17 DIAGNOSIS — Z3A37 37 weeks gestation of pregnancy: Secondary | ICD-10-CM

## 2019-11-17 DIAGNOSIS — Z8279 Family history of other congenital malformations, deformations and chromosomal abnormalities: Secondary | ICD-10-CM

## 2019-11-17 MED ORDER — PRENATAL COMPLETE 14-0.4 MG PO TABS: 1.0000 | ORAL_TABLET | Freq: Every day | ORAL | 0 refills | Status: DC

## 2019-11-18 ENCOUNTER — Encounter: Payer: Self-pay | Admitting: Obstetrics and Gynecology

## 2019-11-18 NOTE — Progress Notes (Signed)
Pt sent from [facility] for discussion of ECV. She was found to be breech at growth [place] at MFM yesterday and is G1P0 @ [redacted]w[redacted]d today. BMI 76 with EFW > 99th%tile.  Reviewed risks of external cephalic version including risk of pain, bleeding, abruption, fetal intolerance, injury to fetus, failure for turn fetus. Reviewed risk of need for urgent primary c-section for fetal intolerance, including risks of c-section of infection, bleeding, damage to surrounding tissue and organs. Reviewed that if procedure failed and she remains breech, would be scheduled for primary c-section. Reviewed failure rate of 50% in ideal candidates and factors such as large fetal size and pt BMI would affect success rates.   She verbalizes understanding and declines ECV, have scheduled for primary c-section for breech for 12/02/19.    Baldemar Lenis, M.D. Attending Center for [employer] Midwife)

## 2019-11-23 NOTE — Patient Instructions (Signed)
Alyssa Woodard  11/23/2019   Your procedure is scheduled on:  12/02/2019  Arrive at 1030 at Entrance C on [address] at [facility]  and [address]. You are invited to use the FREE valet parking or use the Visitor's parking deck.  Pick up the phone at the desk and dial [phone].  Call this number if you have problems the morning of surgery: [phone]  Remember:   Do not eat food:(After Midnight) Desps de medianoche.  Do not drink clear liquids: (After Midnight) Desps de medianoche.  Take these medicines the morning of surgery with A SIP OF WATER:  none   Do not wear jewelry, make-up or nail polish.  Do not wear lotions, powders, or perfumes. Do not wear deodorant.  Do not shave 48 hours prior to surgery.  Do not bring valuables to the hospital.  [facility] is not   responsible for any belongings or valuables brought to the hospital.  Contacts, dentures or bridgework may not be worn into surgery.  Leave suitcase in the car. After surgery it may be brought to your room.  For patients admitted to the hospital, checkout time is 11:00 AM the day of              discharge.      Please read over the following fact sheets that you were given:     Preparing for Surgery

## 2019-11-24 ENCOUNTER — Telehealth (HOSPITAL_COMMUNITY): Payer: Self-pay | Admitting: *Deleted

## 2019-11-24 NOTE — Telephone Encounter (Signed)
Preadmission screen Unable to reach patient, spoke with Linus Orn at [facility] Department.  She is attempting to obtain another number for the patient. Patient is scheduled for a visit tomorrow at [facility] Department and will be asked to contact me during that visit.

## 2019-11-25 ENCOUNTER — Telehealth (HOSPITAL_COMMUNITY): Payer: Self-pay | Admitting: *Deleted

## 2019-11-25 NOTE — Telephone Encounter (Signed)
Preadmission screen No answer and no voicemail.  Numbers updated by health department today

## 2019-11-26 ENCOUNTER — Encounter (HOSPITAL_COMMUNITY): Payer: Self-pay

## 2019-11-30 ENCOUNTER — Other Ambulatory Visit (HOSPITAL_COMMUNITY)
Admission: RE | Admit: 2019-11-30 | Discharge: 2019-11-30 | Disposition: A | Discharge status: Home / Self Care | Payer: Medicaid Other | Source: Ambulatory Visit | Attending: Obstetrics and Gynecology | Admitting: Obstetrics and Gynecology

## 2019-11-30 ENCOUNTER — Other Ambulatory Visit: Payer: Self-pay

## 2019-11-30 DIAGNOSIS — Z20822 Contact with and (suspected) exposure to covid-19: Secondary | ICD-10-CM | POA: Insufficient documentation

## 2019-11-30 DIAGNOSIS — Z01812 Encounter for preprocedural laboratory examination: Secondary | ICD-10-CM | POA: Insufficient documentation

## 2019-11-30 LAB — CBC
HCT: 36.2 % (ref 36.0–46.0)
Hemoglobin: 11.7 g/dL — ABNORMAL LOW (ref 12.0–15.0)
MCH: 30.7 pg (ref 26.0–34.0)
MCHC: 32.3 g/dL (ref 30.0–36.0)
MCV: 95 fL (ref 80.0–100.0)
Platelets: 426 10*3/uL — ABNORMAL HIGH (ref 150–400)
RBC: 3.81 MIL/uL — ABNORMAL LOW (ref 3.87–5.11)
RDW: 13.2 % (ref 11.5–15.5)
WBC: 11 10*3/uL — ABNORMAL HIGH (ref 4.0–10.5)
nRBC: 0 % (ref 0.0–0.2)

## 2019-11-30 LAB — ABO/RH: ABO/RH(D): O POS

## 2019-11-30 LAB — TYPE AND SCREEN
ABO/RH(D): O POS
Antibody Screen: NEGATIVE

## 2019-11-30 LAB — SARS CORONAVIRUS 2 (TAT 6-24 HRS): SARS Coronavirus 2: NEGATIVE

## 2019-11-30 LAB — RPR: RPR Ser Ql: NONREACTIVE

## 2019-11-30 NOTE — Progress Notes (Signed)
Presents for Covid-19 test and blood work prior to scheduled surgery.  Denies symptoms.  Tolerated testing well.

## 2019-11-30 NOTE — Anesthesia Preprocedure Evaluation (Addendum)
Anesthesia Evaluation  Patient identified by MRN, date of birth, ID band Patient awake    Reviewed: Allergy & Precautions, NPO status , Patient's Chart, lab work & pertinent test results  History of Anesthesia Complications Negative for: history of anesthetic complications  Airway Mallampati: III  TM Distance: >3 FB Neck ROM: Full    Dental no notable dental hx.    Pulmonary former smoker,    Pulmonary exam normal        Cardiovascular negative cardio ROS Normal cardiovascular exam     Neuro/Psych negative neurological ROS  negative psych ROS   GI/Hepatic negative GI ROS, Neg liver ROS,   Endo/Other  Morbid obesity (BMI 79)  Renal/GU negative Renal ROS  negative genitourinary   Musculoskeletal negative musculoskeletal ROS (+)   Abdominal   Peds  Hematology Hgb 11.7, plt 426   Anesthesia Other Findings Day of surgery medications reviewed with patient.  Reproductive/Obstetrics (+) Pregnancy                            Anesthesia Physical Anesthesia Plan  ASA: IV  Anesthesia Plan: Combined Spinal and Epidural   Post-op Pain Management:    Induction:   PONV Risk Score and Plan: 4 or greater and Treatment may vary due to age or medical condition, Ondansetron and Dexamethasone  Airway Management Planned: Natural Airway  Additional Equipment: None  Intra-op Plan:   Post-operative Plan:   Informed Consent: I have reviewed the patients History and Physical, chart, labs and discussed the procedure including the risks, benefits and alternatives for the proposed anesthesia with the patient or authorized representative who has indicated his/her understanding and acceptance.       Plan Discussed with: CRNA  Anesthesia Plan Comments:        Anesthesia Quick Evaluation

## 2019-12-02 ENCOUNTER — Inpatient Hospital Stay (HOSPITAL_COMMUNITY): Payer: Medicaid Other | Admitting: Anesthesiology

## 2019-12-02 ENCOUNTER — Inpatient Hospital Stay (HOSPITAL_COMMUNITY)
Admit: 2019-12-02 | Discharge: 2019-12-04 | DRG: 788 | Disposition: A | Discharge status: Home / Self Care | Payer: Medicaid Other | Attending: Obstetrics and Gynecology | Admitting: Obstetrics and Gynecology

## 2019-12-02 ENCOUNTER — Encounter (HOSPITAL_COMMUNITY): Payer: Self-pay | Admitting: Obstetrics and Gynecology

## 2019-12-02 ENCOUNTER — Encounter (HOSPITAL_COMMUNITY)
Disposition: A | Discharge status: Home / Self Care | Payer: Self-pay | Source: Home / Self Care | Attending: Obstetrics and Gynecology

## 2019-12-02 ENCOUNTER — Other Ambulatory Visit: Payer: Self-pay

## 2019-12-02 DIAGNOSIS — Z87891 Personal history of nicotine dependence: Secondary | ICD-10-CM | POA: Diagnosis not present

## 2019-12-02 DIAGNOSIS — O321XX0 Maternal care for breech presentation, not applicable or unspecified: Secondary | ICD-10-CM | POA: Diagnosis present

## 2019-12-02 DIAGNOSIS — Z20822 Contact with and (suspected) exposure to covid-19: Secondary | ICD-10-CM | POA: Diagnosis present

## 2019-12-02 DIAGNOSIS — Z3A39 39 weeks gestation of pregnancy: Secondary | ICD-10-CM | POA: Diagnosis not present

## 2019-12-02 DIAGNOSIS — O99214 Obesity complicating childbirth: Secondary | ICD-10-CM | POA: Diagnosis present

## 2019-12-02 DIAGNOSIS — O328XX0 Maternal care for other malpresentation of fetus, not applicable or unspecified: Secondary | ICD-10-CM

## 2019-12-02 DIAGNOSIS — Z6841 Body Mass Index (BMI) 40.0 and over, adult: Secondary | ICD-10-CM

## 2019-12-02 DIAGNOSIS — Z98891 History of uterine scar from previous surgery: Secondary | ICD-10-CM

## 2019-12-02 LAB — CREATININE, SERUM
Creatinine, Ser: 0.76 mg/dL (ref 0.44–1.00)
GFR calc Af Amer: >60 mL/min (ref >60)
GFR calc non Af Amer: >60 mL/min (ref >60)

## 2019-12-02 LAB — CBC
HCT: 32.5 % — ABNORMAL LOW (ref 36.0–46.0)
Hemoglobin: 10.2 g/dL — ABNORMAL LOW (ref 12.0–15.0)
MCH: 29.9 pg (ref 26.0–34.0)
MCHC: 31.4 g/dL (ref 30.0–36.0)
MCV: 95.3 fL (ref 80.0–100.0)
Platelets: 383 10*3/uL (ref 150–400)
RBC: 3.41 MIL/uL — ABNORMAL LOW (ref 3.87–5.11)
RDW: 13.2 % (ref 11.5–15.5)
WBC: 13.3 10*3/uL — ABNORMAL HIGH (ref 4.0–10.5)
nRBC: 0 % (ref 0.0–0.2)

## 2019-12-02 SURGERY — Surgical Case
Anesthesia: Spinal | Site: Abdomen | Wound class: Clean Contaminated

## 2019-12-02 MED ORDER — LIDOCAINE-EPINEPHRINE (PF) 2 %-1:200000 IJ SOLN
INTRAMUSCULAR | Status: DC | PRN
  Administered 2019-12-02: 3 mL via EPIDURAL

## 2019-12-02 MED ORDER — LIDOCAINE-EPINEPHRINE (PF) 2 %-1:200000 IJ SOLN
INTRAMUSCULAR | Status: AC
  Filled 2019-12-02: qty 10

## 2019-12-02 MED ORDER — KETOROLAC TROMETHAMINE 30 MG/ML IJ SOLN
30.0000 mg | Freq: Once | INTRAMUSCULAR | Status: AC
  Administered 2019-12-02: 30 mg via INTRAVENOUS

## 2019-12-02 MED ORDER — KETOROLAC TROMETHAMINE 30 MG/ML IJ SOLN: 30.0000 mg | Freq: Four times a day (QID) | INTRAMUSCULAR | Status: AC | PRN

## 2019-12-02 MED ORDER — LACTATED RINGERS IV SOLN: INTRAVENOUS | Status: DC

## 2019-12-02 MED ORDER — GABAPENTIN 100 MG PO CAPS
100.0000 mg | ORAL_CAPSULE | Freq: Three times a day (TID) | ORAL | Status: DC
  Administered 2019-12-02 – 2019-12-04 (×6): 100 mg via ORAL
  Filled 2019-12-02 (×6): qty 1

## 2019-12-02 MED ORDER — PRENATAL MULTIVITAMIN CH
1.0000 | ORAL_TABLET | Freq: Every day | ORAL | Status: DC
  Administered 2019-12-03: 1 via ORAL
  Filled 2019-12-02 (×2): qty 1

## 2019-12-02 MED ORDER — LACTATED RINGERS IV SOLN: INTRAVENOUS | Status: DC | PRN

## 2019-12-02 MED ORDER — OXYCODONE-ACETAMINOPHEN 5-325 MG PO TABS
1.0000 | ORAL_TABLET | ORAL | Status: DC | PRN
  Administered 2019-12-03 (×2): 1 via ORAL
  Administered 2019-12-04: 2 via ORAL
  Filled 2019-12-02 (×2): qty 1
  Filled 2019-12-02: qty 2

## 2019-12-02 MED ORDER — PHENYLEPHRINE HCL-NACL 20-0.9 MG/250ML-% IV SOLN
INTRAVENOUS | Status: DC | PRN
  Administered 2019-12-02: 60 ug/min via INTRAVENOUS

## 2019-12-02 MED ORDER — OXYTOCIN-SODIUM CHLORIDE 30-0.9 UT/500ML-% IV SOLN
INTRAVENOUS | Status: DC | PRN
  Administered 2019-12-02: 200 mL via INTRAVENOUS

## 2019-12-02 MED ORDER — MENTHOL 3 MG MT LOZG: 1.0000 | LOZENGE | OROMUCOSAL | Status: DC | PRN

## 2019-12-02 MED ORDER — ENOXAPARIN SODIUM 100 MG/ML ~~LOC~~ SOLN
85.0000 mg | SUBCUTANEOUS | Status: DC
  Administered 2019-12-03 – 2019-12-04 (×2): 85 mg via SUBCUTANEOUS
  Filled 2019-12-02 (×4): qty 0.85

## 2019-12-02 MED ORDER — ONDANSETRON HCL 4 MG/2ML IJ SOLN
INTRAMUSCULAR | Status: DC | PRN
  Administered 2019-12-02: 4 mg via INTRAVENOUS

## 2019-12-02 MED ORDER — SIMETHICONE 80 MG PO CHEW
80.0000 mg | CHEWABLE_TABLET | Freq: Three times a day (TID) | ORAL | Status: DC
  Administered 2019-12-02 – 2019-12-04 (×5): 80 mg via ORAL
  Filled 2019-12-02 (×5): qty 1

## 2019-12-02 MED ORDER — NALBUPHINE HCL 10 MG/ML IJ SOLN
5.0000 mg | INTRAMUSCULAR | Status: DC | PRN
  Administered 2019-12-03: 5 mg via INTRAVENOUS
  Filled 2019-12-02: qty 1

## 2019-12-02 MED ORDER — ONDANSETRON HCL 4 MG/2ML IJ SOLN
INTRAMUSCULAR | Status: AC
  Filled 2019-12-02: qty 2

## 2019-12-02 MED ORDER — SODIUM CHLORIDE 0.9% FLUSH: 3.0000 mL | INTRAVENOUS | Status: DC | PRN

## 2019-12-02 MED ORDER — ONDANSETRON HCL 4 MG/2ML IJ SOLN: 4.0000 mg | Freq: Three times a day (TID) | INTRAMUSCULAR | Status: DC | PRN

## 2019-12-02 MED ORDER — NALOXONE HCL 4 MG/10ML IJ SOLN
1.0000 ug/kg/h | INTRAVENOUS | Status: DC | PRN
  Filled 2019-12-02: qty 5

## 2019-12-02 MED ORDER — DIPHENHYDRAMINE HCL 25 MG PO CAPS: 25.0000 mg | ORAL_CAPSULE | ORAL | Status: DC | PRN

## 2019-12-02 MED ORDER — LIDOCAINE HCL (PF) 1 % IJ SOLN
INTRAMUSCULAR | Status: AC
  Filled 2019-12-02: qty 5

## 2019-12-02 MED ORDER — OXYTOCIN-SODIUM CHLORIDE 30-0.9 UT/500ML-% IV SOLN: 2.5000 [IU]/h | INTRAVENOUS | Status: AC

## 2019-12-02 MED ORDER — SOD CITRATE-CITRIC ACID 500-334 MG/5ML PO SOLN
30.0000 mL | ORAL | Status: AC
  Administered 2019-12-02: 30 mL via ORAL

## 2019-12-02 MED ORDER — NALBUPHINE HCL 10 MG/ML IJ SOLN: 5.0000 mg | INTRAMUSCULAR | Status: DC | PRN

## 2019-12-02 MED ORDER — STERILE WATER FOR IRRIGATION IR SOLN
Status: DC | PRN
  Administered 2019-12-02: 1

## 2019-12-02 MED ORDER — MORPHINE SULFATE (PF) 0.5 MG/ML IJ SOLN
INTRAMUSCULAR | Status: AC
  Filled 2019-12-02: qty 10

## 2019-12-02 MED ORDER — SENNOSIDES-DOCUSATE SODIUM 8.6-50 MG PO TABS
2.0000 | ORAL_TABLET | ORAL | Status: DC
  Administered 2019-12-03 – 2019-12-04 (×2): 2 via ORAL
  Filled 2019-12-02 (×2): qty 2

## 2019-12-02 MED ORDER — NALBUPHINE HCL 10 MG/ML IJ SOLN: 5.0000 mg | Freq: Once | INTRAMUSCULAR | Status: DC | PRN

## 2019-12-02 MED ORDER — DEXAMETHASONE SODIUM PHOSPHATE 4 MG/ML IJ SOLN
INTRAMUSCULAR | Status: AC
  Filled 2019-12-02: qty 1

## 2019-12-02 MED ORDER — DIBUCAINE (PERIANAL) 1 % EX OINT: 1.0000 "application " | TOPICAL_OINTMENT | CUTANEOUS | Status: DC | PRN

## 2019-12-02 MED ORDER — OXYTOCIN-SODIUM CHLORIDE 30-0.9 UT/500ML-% IV SOLN
INTRAVENOUS | Status: AC
  Filled 2019-12-02: qty 1000

## 2019-12-02 MED ORDER — PROMETHAZINE HCL 25 MG/ML IJ SOLN: 6.2500 mg | INTRAMUSCULAR | Status: DC | PRN

## 2019-12-02 MED ORDER — FENTANYL CITRATE (PF) 100 MCG/2ML IJ SOLN
INTRAMUSCULAR | Status: AC
  Filled 2019-12-02: qty 2

## 2019-12-02 MED ORDER — SIMETHICONE 80 MG PO CHEW
80.0000 mg | CHEWABLE_TABLET | ORAL | Status: DC | PRN
  Filled 2019-12-02: qty 1

## 2019-12-02 MED ORDER — MORPHINE SULFATE (PF) 0.5 MG/ML IJ SOLN
INTRAMUSCULAR | Status: DC | PRN
  Administered 2019-12-02: .15 mg via INTRATHECAL

## 2019-12-02 MED ORDER — DEXAMETHASONE SODIUM PHOSPHATE 10 MG/ML IJ SOLN
INTRAMUSCULAR | Status: DC | PRN
  Administered 2019-12-02: 10 mg via INTRAVENOUS

## 2019-12-02 MED ORDER — PHENYLEPHRINE HCL-NACL 20-0.9 MG/250ML-% IV SOLN
INTRAVENOUS | Status: AC
  Filled 2019-12-02: qty 500

## 2019-12-02 MED ORDER — IBUPROFEN 800 MG PO TABS
800.0000 mg | ORAL_TABLET | Freq: Three times a day (TID) | ORAL | Status: DC
  Administered 2019-12-03 – 2019-12-04 (×5): 800 mg via ORAL
  Filled 2019-12-02 (×5): qty 1

## 2019-12-02 MED ORDER — ACETAMINOPHEN 160 MG/5ML PO SOLN: 1000.0000 mg | Freq: Once | ORAL | Status: DC

## 2019-12-02 MED ORDER — DIPHENHYDRAMINE HCL 25 MG PO CAPS: 25.0000 mg | ORAL_CAPSULE | Freq: Four times a day (QID) | ORAL | Status: DC | PRN

## 2019-12-02 MED ORDER — FENTANYL CITRATE (PF) 100 MCG/2ML IJ SOLN: 25.0000 ug | INTRAMUSCULAR | Status: DC | PRN

## 2019-12-02 MED ORDER — DEXTROSE 5 % IV SOLN
3.0000 g | INTRAVENOUS | Status: AC
  Administered 2019-12-02: 3 g via INTRAVENOUS

## 2019-12-02 MED ORDER — DEXTROSE 5 % IV SOLN
INTRAVENOUS | Status: AC
  Filled 2019-12-02: qty 3000

## 2019-12-02 MED ORDER — FENTANYL CITRATE (PF) 100 MCG/2ML IJ SOLN
INTRAMUSCULAR | Status: DC | PRN
  Administered 2019-12-02: 15 ug via INTRATHECAL

## 2019-12-02 MED ORDER — PHENYLEPHRINE 40 MCG/ML (10ML) SYRINGE FOR IV PUSH (FOR BLOOD PRESSURE SUPPORT)
PREFILLED_SYRINGE | INTRAVENOUS | Status: AC
  Filled 2019-12-02: qty 30

## 2019-12-02 MED ORDER — PHENYLEPHRINE HCL (PRESSORS) 10 MG/ML IV SOLN
INTRAVENOUS | Status: DC | PRN
  Administered 2019-12-02 (×2): 80 ug via INTRAVENOUS

## 2019-12-02 MED ORDER — TETANUS-DIPHTH-ACELL PERTUSSIS 5-2.5-18.5 LF-MCG/0.5 IM SUSP
0.5000 mL | Freq: Once | INTRAMUSCULAR | Status: AC
  Administered 2019-12-03: 0.5 mL via INTRAMUSCULAR
  Filled 2019-12-02: qty 0.5

## 2019-12-02 MED ORDER — KETOROLAC TROMETHAMINE 30 MG/ML IJ SOLN
INTRAMUSCULAR | Status: AC
  Filled 2019-12-02: qty 1

## 2019-12-02 MED ORDER — DIPHENHYDRAMINE HCL 50 MG/ML IJ SOLN: 12.5000 mg | INTRAMUSCULAR | Status: DC | PRN

## 2019-12-02 MED ORDER — SIMETHICONE 80 MG PO CHEW
80.0000 mg | CHEWABLE_TABLET | ORAL | Status: DC
  Administered 2019-12-03 – 2019-12-04 (×2): 80 mg via ORAL
  Filled 2019-12-02 (×2): qty 1

## 2019-12-02 MED ORDER — COCONUT OIL OIL: 1.0000 "application " | TOPICAL_OIL | Status: DC | PRN

## 2019-12-02 MED ORDER — NALOXONE HCL 0.4 MG/ML IJ SOLN: 0.4000 mg | INTRAMUSCULAR | Status: DC | PRN

## 2019-12-02 MED ORDER — MEDROXYPROGESTERONE ACETATE 150 MG/ML IM SUSP
150.0000 mg | Freq: Once | INTRAMUSCULAR | Status: AC
  Administered 2019-12-04: 150 mg via INTRAMUSCULAR
  Filled 2019-12-02: qty 1

## 2019-12-02 MED ORDER — WITCH HAZEL-GLYCERIN EX PADS: 1.0000 "application " | MEDICATED_PAD | CUTANEOUS | Status: DC | PRN

## 2019-12-02 MED ORDER — ACETAMINOPHEN 500 MG PO TABS
1000.0000 mg | ORAL_TABLET | Freq: Four times a day (QID) | ORAL | Status: AC
  Administered 2019-12-02 – 2019-12-03 (×3): 1000 mg via ORAL
  Filled 2019-12-02 (×4): qty 2

## 2019-12-02 MED ORDER — SODIUM CHLORIDE 0.9 % IR SOLN
Status: DC | PRN
  Administered 2019-12-02: 1

## 2019-12-02 MED ORDER — ACETAMINOPHEN 500 MG PO TABS: 1000.0000 mg | ORAL_TABLET | Freq: Once | ORAL | Status: DC

## 2019-12-02 MED ORDER — BUPIVACAINE IN DEXTROSE 0.75-8.25 % IT SOLN
INTRATHECAL | Status: DC | PRN
  Administered 2019-12-02: 1.6 mL via INTRATHECAL

## 2019-12-02 MED ORDER — SOD CITRATE-CITRIC ACID 500-334 MG/5ML PO SOLN
ORAL | Status: AC
  Filled 2019-12-02: qty 30

## 2019-12-02 SURGICAL SUPPLY — 30 items
CHLORAPREP W/TINT 26ML (MISCELLANEOUS) ×3 IMPLANT
CLAMP CORD UMBIL (MISCELLANEOUS) IMPLANT
DRSG OPSITE POSTOP 4X10 (GAUZE/BANDAGES/DRESSINGS) ×3 IMPLANT
ELECT REM PT RETURN 9FT ADLT (ELECTROSURGICAL) ×3
ELECTRODE REM PT RTRN 9FT ADLT (ELECTROSURGICAL) ×1 IMPLANT
EXTRACTOR VACUUM M CUP 4 TUBE (SUCTIONS) IMPLANT
EXTRACTOR VACUUM M CUP 4' TUBE (SUCTIONS)
GLOVE BIOGEL PI IND STRL 6.5 (GLOVE) ×1 IMPLANT
GLOVE BIOGEL PI IND STRL 7.0 (GLOVE) ×1 IMPLANT
GLOVE BIOGEL PI INDICATOR 6.5 (GLOVE) ×2
GLOVE BIOGEL PI INDICATOR 7.0 (GLOVE) ×2
GLOVE SURG SS PI 6.5 STRL IVOR (GLOVE) ×3 IMPLANT
GOWN STRL REUS W/TWL LRG LVL3 (GOWN DISPOSABLE) ×6 IMPLANT
KIT ABG SYR 3ML LUER SLIP (SYRINGE) IMPLANT
NEEDLE HYPO 25X5/8 SAFETYGLIDE (NEEDLE) IMPLANT
NS IRRIG 1000ML POUR BTL (IV SOLUTION) ×3 IMPLANT
PACK C SECTION WH (CUSTOM PROCEDURE TRAY) ×3 IMPLANT
PAD OB MATERNITY 4.3X12.25 (PERSONAL CARE ITEMS) ×3 IMPLANT
PENCIL SMOKE EVAC W/HOLSTER (ELECTROSURGICAL) ×3 IMPLANT
PREVENA RESTOR AXIOFORM 29X28 (GAUZE/BANDAGES/DRESSINGS) ×3 IMPLANT
RTRCTR C-SECT PINK 25CM LRG (MISCELLANEOUS) IMPLANT
SUT PLAIN 0 NONE (SUTURE) IMPLANT
SUT PLAIN 2 0 XLH (SUTURE) ×3 IMPLANT
SUT VIC AB 0 CT1 36 (SUTURE) ×15 IMPLANT
SUT VIC AB 4-0 KS 27 (SUTURE) ×3 IMPLANT
TOWEL OR 17X24 6PK STRL BLUE (TOWEL DISPOSABLE) ×3 IMPLANT
TRAY FOLEY W/BAG SLVR 14FR LF (SET/KITS/TRAYS/PACK) ×3 IMPLANT
TUBING NON-CON 1/4 X 20 CONN (TUBING) ×2 IMPLANT
TUBING NON-CON 1/4 X 20' CONN (TUBING) ×1
WATER STERILE IRR 1000ML POUR (IV SOLUTION) ×3 IMPLANT

## 2019-12-02 NOTE — Lactation Note (Signed)
This note was copied from a baby's chart. Lactation Consultation Note  Patient Name: Alyssa Woodard Today's Date: 12/02/2019 Reason for consult: Initial assessment;Term;Primapara;1st time breastfeeding;Other (Comment) (LGA)  P1 mother whose infant is now 49 hours old.  This is a term baby at 39+1 weeks weighing 10 lbs.  Mother is developmentally delayed and FOB is also developmentally delayed.  Father of baby not currently in room at the time of my visit.  Mother had her mother on face time and phone was on constantly throughout my visit.    RN requested latch assistance.  Baby was awake and alert.  Offered to assist with latching and mother agreeable.  Mother's breasts are large, soft and non tender and nipples are very short shafted and intact.  Positioned mother appropriately and discussed using the football position.  Taught hand expression and with practice mother was able to express a few drops of colostrum which I finger fed back to baby.  Mother will need review of hand expression.  Colostrum container provided and milk storage times discussed.    Assisted baby to latch to the left breast after a couple of attempts.  He had a wide gape but was not at all interested in sucking once at the breast.  Demonstrated gentle stimulation and breast compressions for mother to observe.  Reminded mother a few times on correct hand/finger positioning and to keep her fingers away from baby's mouth and nose when latching.  Removed baby from the breast and showed mother techniques to help awaken him.  Latched again but he would not suck.  Placed him STS on mother's chest where he fell asleep.  Side rails up.  Asked mother if she felt comfortable holding him and if she felt safe holding him STS.  She replied, "Yes."  Asked her to place baby in the bassinet if she gets too sleepy to hold him.  Mother verbalized understanding.  During my visit mother had a short attention span and had some difficulty focusing  on my basic teaching.  She was interested in her phone and talking with her mother which may have made her feel more secure.  Mother was very pleasant during my visit.  She did not ask any specific questions, although, I suspect she did not know what to ask in regards to breast feeding.  Suggested she call her RN for any questions/concerns tonight.  Report given to current RN and I also saw the night shift RN.  Asked her to introduce the manual pump and breast shells for nipple eversion.  It is best not to sensory overload with mother; introduce concepts gradually with much reinforcement to help retain information provided.  Mother did get a little distracted and focused on her tv and channel buttons while basic education provided.  She also interrupted our discussion a couple of times with questions unrelated to what we were discussing.  I recommend leaving her door slightly ajar and monitoring frequently tonight; it may be a good idea to have baby rest in the nursery in between feedings tonight if mother becomes very drowsy and does not have any assistance.  Consulting civil engineer at desk when this discussion was being held between myself, Clinical cytogeneticist and night shift RN.  Frequent monitoring would be most beneficial.  Evening shift LC updated.     Maternal Data Formula Feeding for Exclusion: No Has patient been taught Hand Expression?: Yes Does the patient have breastfeeding experience prior to this delivery?: No  Feeding  LATCH Score Latch: Too sleepy or reluctant, no latch achieved, no sucking elicited.  Audible Swallowing: None  Type of Nipple: Flat  Comfort (Breast/Nipple): Soft / non-tender  Hold (Positioning): Assistance needed to correctly position infant at breast and maintain latch.  LATCH Score: 4  Interventions Interventions: Breast feeding basics reviewed;Assisted with latch;Skin to skin;Breast massage;Hand express;Adjust position;Position options;Support pillows  Lactation Tools  Discussed/Used WIC Program: Yes   Consult Status Consult Status: Follow-up Date: 12/03/19 Follow-up type: In-patient    Caridad Silveira R Paityn Balsam 12/02/2019, 7:02 PM

## 2019-12-02 NOTE — Discharge Summary (Addendum)
   Postpartum Discharge Summary     Patient Name: Alyssa Woodard DOB: 09/01/1995 MRN: [id number]  Date of admission: 12/02/2019 Delivery date:12/02/2019  Delivering provider: CONSTANT, PEGGY  Date of discharge: 12/04/2019  Admitting diagnosis: Breech presentation [O32.1XX0] S/P cesarean section [Z98.891] Intrauterine pregnancy: [redacted]w[redacted]d     Secondary diagnosis:  Active Problems:   Breech presentation   Morbid obesity with BMI of 70 and over, adult (HCC)   [redacted] weeks gestation of pregnancy   S/P cesarean section  Additional problems: None    Discharge diagnosis: Term Pregnancy Delivered                                              Post partum procedures:none Augmentation: N/A Complications: None  Hospital course: Sceduled C/S   24 y.o. yo G1P0 at [redacted]w[redacted]d was admitted to the hospital 12/02/2019 for scheduled cesarean section with the following indication:Malpresentation.Delivery details are as follows:  Membrane Rupture Time/Date: 1:44 PM ,12/02/2019   Delivery Method:C-Section, Low Transverse  Details of operation can be found in separate operative note. Depo ordered prior to discharge. SW saw patient and did not identify barriers to discharge. Patient had an uncomplicated postpartum course.  She is ambulating, tolerating a regular diet, passing flatus, and urinating well. Patient is discharged home in stable condition on  12/04/19        Newborn Data: Birth date:12/02/2019  Birth time:1:44 PM  Gender:Female  Living status:Living  Apgars:7 ,8  Weight:4535 g     Magnesium Sulfate received: No BMZ received: No Rhophylac:N/A MMR:N/A T-DaP:Given prenatally Flu: No Transfusion:No  Physical exam  Vitals:   12/03/19 1148 12/03/19 1422 12/03/19 2151 12/04/19 0512  BP:  135/85 114/77 105/81  Pulse:  (!) 108 (!) 111 99  Resp:  18 18 18  Temp:  97.6 F (36.4 C) 98.4 F (36.9 C) 98.1 F (36.7 C)  TempSrc:  Oral Oral Oral  SpO2: 99% 99%  98%  Weight:      Height:       General:  alert, cooperative and no distress Lochia: appropriate Uterine Fundus: firm Incision: Dressing is clean, dry, and intact, prevena in place DVT Evaluation: No evidence of DVT seen on physical exam Labs: Lab Results  Component Value Date   WBC 13.3 (H) 12/03/2019   HGB 9.0 (L) 12/03/2019   HCT 28.4 (L) 12/03/2019   MCV 96.3 12/03/2019   PLT 324 12/03/2019   CMP Latest Ref Rng & Units 12/02/2019  Glucose 65 - 99 mg/dL -  BUN 6 - 20 mg/dL -  Creatinine 0.44 - 1.00 mg/dL 0.76  Sodium 135 - 145 mmol/L -  Potassium 3.5 - 5.1 mmol/L -  Chloride 101 - 111 mmol/L -  CO2 22 - 32 mmol/L -  Calcium 8.9 - 10.3 mg/dL -  Total Protein 6.5 - 8.1 g/dL -  Total Bilirubin 0.3 - 1.2 mg/dL -  Alkaline Phos 38 - 126 U/L -  AST 15 - 41 U/L -  ALT 14 - 54 U/L -   Edinburgh Score: Edinburgh Postnatal Depression Scale Screening Tool 12/02/2019  I have been able to laugh and see the funny side of things. 3  I have looked forward with enjoyment to things. 0  I have blamed myself unnecessarily when things went wrong. 0  I have been anxious or worried for no good reason. 0  I   have felt scared or panicky for no good reason. 0  Things have been getting on top of me. 0  I have been so unhappy that I have had difficulty sleeping. 0  I have felt sad or miserable. 0  I have been so unhappy that I have been crying. 0  The thought of harming myself has occurred to me. 0  Edinburgh Postnatal Depression Scale Total 3     After visit meds:  Allergies as of 12/04/2019   No Known Allergies     Medication List    STOP taking these medications   acetaminophen 500 MG tablet Commonly known as: TYLENOL     TAKE these medications   ferrous sulfate 325 (65 FE) MG tablet Take 1 tablet (325 mg total) by mouth every other day. Start taking on: December 05, 2019   ibuprofen 800 MG tablet Commonly known as: ADVIL Take 1 tablet (800 mg total) by mouth every 8 (eight) hours.   oxyCODONE-acetaminophen 5-325 MG  tablet Commonly known as: PERCOCET/ROXICET Take 1-2 tablets by mouth every 4 (four) hours as needed for moderate pain.   Prenatal Complete 14-0.4 MG Tabs Take 1 tablet by mouth daily.   senna-docusate 8.6-50 MG tablet Commonly known as: Senokot-S Take 2 tablets by mouth daily. Start taking on: December 05, 2019        Discharge home in stable condition Infant Feeding: Breast Infant Disposition:home with mother Discharge instruction: per After Visit Summary and Postpartum booklet. Activity: Advance as tolerated. Pelvic rest for 6 weeks.  Diet: routine diet Future Appointments: Future Appointments  Date Time Provider [place]  12/09/2019  1:30 PM Constant, Vickii Chafe, MD [place] None   Follow up Visit:   Appt requested at [facility] in 1 week for incision check and Prevena removal; rest of care per HD.   12/04/2019 Chauncey Mann, MD

## 2019-12-02 NOTE — Anesthesia Procedure Notes (Signed)
Epidural Patient location during procedure: OR Start time: 12/02/2019 12:45 PM End time: 12/02/2019 1:15 PM  Staffing Anesthesiologist: Kaylyn Layer, MD Performed: anesthesiologist   Preanesthetic Checklist Completed: patient identified, IV checked, risks and benefits discussed, monitors and equipment checked, pre-op evaluation and timeout performed  Epidural Patient position: sitting Prep: DuraPrep and site prepped and draped Patient monitoring: heart rate, continuous pulse ox and blood pressure Approach: midline Location: L3-L4 Injection technique: LOR air  Needle:  Needle type: Tuohy  Needle gauge: 17 G Needle length: 15 cm Needle insertion depth: 14 cm Catheter type: closed end flexible Catheter size: 19 Gauge Catheter at skin depth: 22 cm Test dose: negative and 2% lidocaine with Epi 1:200 K  Assessment Sensory level: T4 Events: blood not aspirated, injection not painful, no injection resistance, no paresthesia and negative IV test  Additional Notes Patient identified. Risks, benefits, and alternatives discussed with patient including but not limited to bleeding, infection, nerve damage, paralysis, failed block, incomplete pain control, headache, blood pressure changes, nausea, vomiting, reactions to medication, itching, and postpartum back pain. Confirmed the patient's most recent platelet count. Confirmed with patient that they are not currently taking any anticoagulation, have any bleeding history, or any family history of bleeding disorders. Patient expressed understanding and wished to proceed. All questions were answered. Sterile technique was used throughout the entire procedure. Please see notes for vital signs.   Difficult placement due to body habitus. Ultrasound used to identify midline prior to procedure. CRNA assisted with lumbar pannus retraction during procedure. Multiple attempts by myself at approximately L3-4 with both 9cm and 15cm Tuohy needles with  eventual LOR but no CSF flow through spinal needle. Second anesthesiologist, Dr. Helmut Muster, called after 30 minutes for next attempts. Several more attempts required at 2 locations prior to LOR with Tuohy and CSF flow through [place] 20cm spinal needle.  Clear CSF return prior to injection of intrathecal medication. Spinal needle withdrawn and epidural catheter threaded easily. Negative aspiration of catheter for heme or CSF. Negative IV test dose through epidural catheter.Reason for block:surgical anesthesia

## 2019-12-02 NOTE — H&P (Signed)
Obstetric Preoperative History and Physical  Alyssa Woodard is a 24 y.o. G1P0 with IUP at [redacted]w[redacted]d presenting for scheduled cesarean section.  Reports good fetal movement, no bleeding, no contractions, no leaking of fluid.  No acute preoperative concerns.    Cesarean Section Indication: malpresentation: breech  Prenatal Course Source of Care: [facility]   Pregnancy complications or risks: Patient Active Problem List   Diagnosis Date Noted   Breech presentation 12/02/2019   Morbid obesity with BMI of 70 and over, adult (HCC) 12/02/2019   [redacted] weeks gestation of pregnancy 12/02/2019   She plans to breastfeed She desires Depo-Provera for postpartum contraception.   Prenatal labs and studies: ABO, Rh: --/--/O POS, O POS Performed at [facility], [address] [place], [place] [address]  [phone]) Antibody: NEG (06/22 9485) Rubella: Immune (12/14 0000) RPR: NON REACTIVE (06/22 0858)  HBsAg: Negative (12/14 0000)  HIV: Non-reactive (12/14 0000)  IOE:VOJJKKXF/-- (06/03 0000) 3 hr Glucola WNL Genetic screening NT normal, neg NIPS Anatomy US normal  Prenatal Transfer Tool  Maternal Diabetes: No Genetic Screening: NT normal, neg NIPS Maternal Ultrasounds/Referrals: Normal Fetal Ultrasounds or other Referrals:  Fetal Echo normal  Maternal Substance Abuse:  No Significant Maternal Medications:  None Significant Maternal Lab Results: Group B Strep negative  Past Medical History:  Diagnosis Date   Medical history non-contributory     Past Surgical History:  Procedure Laterality Date   NO PAST SURGERIES      OB History  Gravida Para Term Preterm AB Living  1         0  SAB TAB Ectopic Multiple Live Births               # Outcome Date GA Lbr Len/2nd Weight Sex Delivery Anes PTL Lv  1 Current             Social History   Socioeconomic History   Marital status: Single    Spouse name: Not on file   Number of children: Not on file   Years of education: Not on  file   Highest education level: Not on file  Occupational History   Not on file  Tobacco Use   Smoking status: Former Smoker    Types: Cigarettes   Smokeless tobacco: Never Used   Tobacco comment: none since 04/22/2019  Vaping Use   Vaping Use: Never used  Substance and Sexual Activity   Alcohol use: Not Currently    Comment: socially   Drug use: Not Currently   Sexual activity: Yes    Comment: hasn't been to get depo shot in 3 months.  Other Topics Concern   Not on file  Social History Narrative   Not on file   Social Determinants of Health   Financial Resource Strain:    Difficulty of Paying Living Expenses:   Food Insecurity:    Worried About Programme researcher, broadcasting/film/video in the Last Year:    Barista in the Last Year:   Transportation Needs:    Freight forwarder (Medical):    Lack of Transportation (Non-Medical):   Physical Activity:    Days of Exercise per Week:    Minutes of Exercise per Session:   Stress:    Feeling of Stress :   Social Connections:    Frequency of Communication with Friends and Family:    Frequency of Social Gatherings with Friends and Family:    Attends Religious Services:    Active Member  of Clubs or Organizations:    Attends Music therapist:    Marital Status:     Family History  Problem Relation Age of Onset   Hypertension Mother    Cancer Maternal Aunt     Medications Prior to Admission  Medication Sig Dispense Refill Last Dose   acetaminophen (TYLENOL) 500 MG tablet Take 1,000 mg by mouth every 6 (six) hours as needed for headache.      Prenatal Vit-Fe Fumarate-FA (PRENATAL COMPLETE) 14-0.4 MG TABS Take 1 tablet by mouth daily. 60 tablet 0 12/01/2019 at Unknown time    No Known Allergies  Review of Systems: Pertinent items noted in HPI and remainder of comprehensive ROS otherwise negative.  Physical Exam: BP 104/70    Pulse (!) 102    Temp 99 F (37.2 C) (Oral)    Resp 20    Ht  5\' 1"  (1.549 m)    Wt (!) 190.5 kg    LMP 03/20/2019 (Exact Date)    SpO2 97%    BMI 79.36 kg/m  FHR by Doppler: 148 bpm CONSTITUTIONAL: Well-developed, well-nourished female in no acute distress. Obese.  HENT:  Normocephalic, atraumatic, External right and left ear normal. Oropharynx is clear and moist EYES: Conjunctivae and EOM are normal. No scleral icterus.  NECK: Normal range of motion, supple, no masses SKIN: Skin is warm and dry. No rash noted. Not diaphoretic. No erythema. No pallor. [place]: Alert and oriented to person, place, and time. Normal reflexes, muscle tone coordination. No cranial nerve deficit noted. PSYCHIATRIC: Normal mood and affect. Normal behavior. Normal judgment and thought content. CARDIOVASCULAR: Normal heart rate noted RESPIRATORY: Effort and breath sounds normal, no problems with respiration noted ABDOMEN: Soft, nontender, nondistended, gravid.  PELVIC: Deferred MUSCULOSKELETAL: Normal range of motion. No edema and no tenderness. 2+ distal pulses.   Pertinent Labs/Studies:   Results for orders placed or performed during the hospital encounter of 11/30/19 (from the past 72 hour(s))  SARS CORONAVIRUS 2 (TAT 6-24 HRS) Nasopharyngeal Nasopharyngeal Swab     Status: None   Collection Time: 11/30/19  8:53 AM   Specimen: Nasopharyngeal Swab  Result Value Ref Range   SARS Coronavirus 2 NEGATIVE NEGATIVE    Comment: (NOTE) SARS-CoV-2 target nucleic acids are NOT DETECTED.  The SARS-CoV-2 RNA is generally detectable in upper and lower respiratory specimens during the acute phase of infection. Negative results do not preclude SARS-CoV-2 infection, do not rule out co-infections with other pathogens, and should not be used as the sole basis for treatment or other patient management decisions. Negative results must be combined with clinical observations, patient history, and epidemiological information. The expected result is Negative.  Fact Sheet for  Patients: SugarRoll.be  Fact Sheet for Healthcare Providers: https://www.woods-mathews.com/  This test is not yet approved or cleared by the [place] FDA and  has been authorized for detection and/or diagnosis of SARS-CoV-2 by FDA under an Emergency Use Authorization (EUA). This EUA will remain  in effect (meaning this test can be used) for the duration of the COVID-19 declaration under Se ction 564(b)(1) of the Act, 21 U.S.C. section 360bbb-3(b)(1), unless the authorization is terminated or revoked sooner.  Performed at [facility], [place] [address] [place] [address]   CBC     Status: Abnormal   Collection Time: 11/30/19  8:58 AM  Result Value Ref Range   WBC 11.0 (H) 4.0 - 10.5 K/uL   RBC 3.81 (L) 3.87 - 5.11 MIL/uL   Hemoglobin  11.7 (L) 12.0 - 15.0 g/dL   HCT 65.4 36 - 46 %   MCV 95.0 80.0 - 100.0 fL   MCH 30.7 26.0 - 34.0 pg   MCHC 32.3 30.0 - 36.0 g/dL   RDW 65.0 35.4 - 65.6 %   Platelets 426 (H) 150 - 400 K/uL   nRBC 0.0 0.0 - 0.2 %    Comment: Performed at [facility], [address] [place], [place] [address]  RPR     Status: None   Collection Time: 11/30/19  8:58 AM  Result Value Ref Range   RPR Ser Ql NON REACTIVE NON REACTIVE    Comment: Performed at [facility], [address], [place], [place] [address]  Type and screen [facility]     Status: None   Collection Time: 11/30/19  8:58 AM  Result Value Ref Range   ABO/RH(D) O POS    Antibody Screen NEG    Sample Expiration      12/03/2019,2359 Performed at [facility], [address], [place], [place] [address]   ABO/Rh     Status: None   Collection Time: 11/30/19  8:58 AM  Result Value Ref Range   ABO/RH(D)      O POS Performed at [facility], [address], [place], [place] [address]     Assessment and Plan: NICKISHA HUM is a 24 y.o. G1P0 at [redacted]w[redacted]d being admitted for scheduled cesarean section.  The risks of cesarean section discussed with the patient included but were not limited to: bleeding which may require transfusion or reoperation; infection which may require antibiotics; injury to bowel, bladder, ureters or other surrounding organs; injury to the fetus; need for additional procedures including hysterectomy in the event of a life-threatening hemorrhage; placental abnormalities with subsequent pregnancies, incisional problems, thromboembolic phenomenon and other postoperative/anesthesia complications. The patient concurred with the proposed plan, giving informed written consent for the procedure. Patient has been NPO since last night she will remain NPO for procedure. Anesthesia and OR aware. Preoperative prophylactic antibiotics and SCDs ordered on call to the OR. To OR when ready.   Pregnancy Complications:  - Morbid Obesity: will plan for Prevena - Infant still in breech presentation on [place] today Contraception: Depo; discussed decreased efficacy in obesity MOF: Breast Circumcision: inpatient   Jerilynn Birkenhead, MD [facility] Family Medicine Fellow, [facility] for [facility], [facility]

## 2019-12-02 NOTE — Op Note (Signed)
Alyssa Woodard PROCEDURE DATE: 12/02/2019  PREOPERATIVE DIAGNOSIS: Intrauterine pregnancy at  [redacted]w[redacted]d weeks gestation; malpresentation: complete breech  POSTOPERATIVE DIAGNOSIS: The same  PROCEDURE:     Cesarean Section  SURGEON:  Dr. Catalina Antigua  ASSISTANT: Dr. Jerilynn Birkenhead  INDICATIONS: Alyssa Woodard is a 24 y.o. G1P0 at [redacted]w[redacted]d scheduled for cesarean section secondary to malpresentation: breech presentation.  The risks of cesarean section discussed with the patient included but were not limited to: bleeding which may require transfusion or reoperation; infection which may require antibiotics; injury to bowel, bladder, ureters or other surrounding organs; injury to the fetus; need for additional procedures including hysterectomy in the event of a life-threatening hemorrhage; placental abnormalities wth subsequent pregnancies, incisional problems, thromboembolic phenomenon and other postoperative/anesthesia complications. The patient concurred with the proposed plan, giving informed written consent for the procedure.    FINDINGS:  Viable female infant in breech presentation.  Apgars 7 and 8.  Clear amniotic fluid.  Intact placenta, three vessel cord.  Normal uterus, fallopian tubes and ovaries bilaterally.  ANESTHESIA:    Spinal INTRAVENOUS FLUIDS:1200 ml ESTIMATED BLOOD LOSS: 1002 ml URINE OUTPUT:  150 ml SPECIMENS: Placenta sent to L&D COMPLICATIONS: None immediate  PROCEDURE IN DETAIL:  The patient received intravenous antibiotics and had sequential compression devices applied to her lower extremities while in the preoperative area.  She was then taken to the operating room where anesthesia was induced and was found to be adequate. A foley catheter was placed into her bladder and attached to Janet Humphreys gravity. She was then placed in a dorsal supine position with a leftward tilt, and prepped and draped in a sterile manner. After an adequate timeout was performed, a Pfannenstiel skin  incision was made with scalpel and carried through to the underlying layer of fascia. The fascia was incised in the midline and this incision was extended bilaterally using the Mayo scissors. Kocher clamps were applied to the superior aspect of the fascial incision and the underlying rectus muscles were dissected off bluntly. A similar process was carried out on the inferior aspect of the facial incision. The rectus muscles were separated in the midline bluntly and the peritoneum was entered bluntly. The Alexis self-retaining retractor was introduced into the abdominal cavity. Attention was turned to the lower uterine segment where a transverse hysterotomy was made with a scalpel and extended bilaterally bluntly. The infant was successfully delivered and delayed cord clamping was performed for 1 minute. The cord was clamped and cut and infant was handed over to awaiting neonatology team. Uterine massage was then administered and the placenta delivered intact with three-vessel cord. The uterus was cleared of clot and debris.  The hysterotomy was closed with 0 Vicryl in a running locked fashion, and an imbricating layer was also placed with a 0 Vicryl. Overall, excellent hemostasis was noted. The pelvis copiously irrigated and cleared of all clot and debris. Hemostasis was confirmed on all surfaces.  The peritoneum and the muscles were reapproximated using 0 vicryl interrupted stitches. The fascia was then closed using 0 Vicryl in a running fashion.  The subcutaneous layer was reapproximated with plain gut and the skin was closed in a subcuticular fashion using 3.0 Vicryl. A prevena wound vac was applied over the incision. The patient tolerated the procedure well. Sponge, lap, instrument and needle counts were correct x 2. She was taken to the recovery room in stable condition.    Jeret Goyer ConstantMD  12/02/2019 2:10 PM

## 2019-12-03 LAB — CBC
HCT: 28.4 % — ABNORMAL LOW (ref 36.0–46.0)
Hemoglobin: 9 g/dL — ABNORMAL LOW (ref 12.0–15.0)
MCH: 30.5 pg (ref 26.0–34.0)
MCHC: 31.7 g/dL (ref 30.0–36.0)
MCV: 96.3 fL (ref 80.0–100.0)
Platelets: 324 10*3/uL (ref 150–400)
RBC: 2.95 MIL/uL — ABNORMAL LOW (ref 3.87–5.11)
RDW: 13.3 % (ref 11.5–15.5)
WBC: 13.3 10*3/uL — ABNORMAL HIGH (ref 4.0–10.5)
nRBC: 0 % (ref 0.0–0.2)

## 2019-12-03 MED ORDER — FERROUS SULFATE 325 (65 FE) MG PO TABS
325.0000 mg | ORAL_TABLET | ORAL | Status: DC
  Administered 2019-12-03: 325 mg via ORAL
  Filled 2019-12-03: qty 1

## 2019-12-03 NOTE — Plan of Care (Signed)
  Problem: Clinical Measurements: Goal: Ability to maintain clinical measurements within normal limits will improve Outcome: Progressing   Problem: Activity: Goal: Risk for activity intolerance will decrease Outcome: Progressing   Problem: Nutrition: Goal: Adequate nutrition will be maintained Outcome: Progressing   Problem: Elimination: Goal: Will not experience complications related to urinary retention Outcome: Progressing   Problem: Pain Managment: Goal: General experience of comfort will improve Outcome: Progressing   Problem: Education: Goal: Knowledge of condition will improve Outcome: Progressing   Problem: Role Relationship: Goal: Ability to demonstrate positive interaction with newborn will improve Outcome: Progressing   Patient progressing well through shift. Patient has willingly ambulated and participated in the plan of care for her postpartum period without complaints.   Pain is managed per MAR. Voiding without difficulty, encouraging her to drink more fluids.  Encouraging her to get rest when able.  Her family is very involved by phone and video call. Patient is bonding well with baby and participating in baby care.

## 2019-12-03 NOTE — Clinical Social Work Maternal (Signed)
CLINICAL SOCIAL WORK MATERNAL/CHILD NOTE  Patient Details  Name: Alyssa Woodard MRN: [id number] Date of Birth: [birth year]  Date:  12/03/2019  Clinical Social Worker Initiating Note:  Elijio Miles Date/Time: Initiated:  12/03/19/1102     Child's Name:  Alyssa Woodard   Biological Parents:  Mother, Father Craig Staggers DOB:05/02/1987)   Need for Interpreter:  None   Reason for Referral:  Behavioral Health Concerns, Current Substance Use/Substance Use During Pregnancy    Address:  [address] [place] [address]    Phone number:  [phone] (home)     Additional phone number:   Household Members/Support Persons (HM/SP):   Household Member/Support Person 1, Household Member/Support Person 2   HM/SP Name Relationship DOB or Age  HM/SP -1 Parie Rafalski Mother    HM/SP -2   Brother    HM/SP -3        HM/SP -4        HM/SP -5        HM/SP -6        HM/SP -7        HM/SP -8          Natural Supports (not living in the home):  Spouse/significant other, Parent, Extended Family, Immediate Family   Professional Supports:     Employment: Unemployed   Type of Work:     Education:  Programmer, systems   Homebound arranged:    Museum/gallery curator Resources:  Medicaid   Other Resources:  [employer]   Cultural/Religious Considerations Which May Impact Care:    Strengths:  Ability to meet basic needs , Home prepared for child , Pediatrician chosen   Psychotropic Medications:         Pediatrician:    [place]  Pediatrician List:   [facility] [place]      Pediatrician Fax Number:    Risk Factors/Current Problems:  Substance Use , Intellectual Development Disorder    Cognitive State:  Able to Concentrate , Alert , Linear Thinking    Mood/Affect:  Calm , Comfortable , Interested , Relaxed    CSW Assessment:  CSW received consult  for THC use during pregnancy. CSW met with MOB to offer support and complete assessment.    MOB resting in bed holding infant skin-to-skin when CSW entered the room. MOB noted to be talking on the phone upon arrival. CSW requested that CSW be able to speak with MOB in private to which MOB expressed understanding and ended phone call. CSW introduced self and explained reason for visit to which MOB expressed understanding. Briefly after introduction, MOB received call from her mother. MOB gave CSW verbal permission to continue visit with MOB's mother on the phone. MOB very pleasant and welcoming of [place] visit. Per MOB, she currently lives with her mother and brother. MOB shared she will have "a lot" of support from family members once home but stated that she feels comfortable caring for infant once discharged. CSW inquired about MOB's mental health history to which MOB denied having any. CSW provided education regarding the baby blues period vs. perinatal mood disorders. CSW recommended self-evaluation during the postpartum time period using the New Mom Checklist from Postpartum Progress and encouraged MOB to contact a medical professional if symptoms are noted at any time. MOB denied any current SI, HI or DV.  CSW inquired  about MOB's substance use during pregnancy. MOB acknowledged use of marijuana prior to finding out she was pregnant but stated she discontinued use after that. CSW informed MOB of Hospital Drug Policy and explained UDS and CDS were still being monitored and a CPS report would be made if warranted. MOB denied any questions or concerns regarding policy.  MOB confirmed having all essential items for infant once discharged and stated infant would be sleeping in a bassinet once home. CSW provided review of Sudden Infant Death Syndrome (SIDS) precautions and safe sleeping habits.    CSW Plan/Description:  No Further Intervention Required/No Barriers to Discharge, Sudden Infant Death Syndrome  (SIDS) Education, Perinatal Mood and Anxiety Disorder (PMADs) Education, [place], CSW Will Continue to Monitor Umbilical Cord Tissue Drug Screen Results and Make Report if Foye Spurling, LCSW 12/03/2019, 11:28 AM

## 2019-12-03 NOTE — Anesthesia Postprocedure Evaluation (Signed)
Anesthesia Post Note  Patient: Alyssa Woodard  Procedure(s) Performed: CESAREAN SECTION (N/A Abdomen)     Patient location during evaluation: PACU Anesthesia Type: Combined Spinal/Epidural Level of consciousness: awake and alert and oriented Pain management: pain level controlled Vital Signs Assessment: post-procedure vital signs reviewed and stable Respiratory status: spontaneous breathing, nonlabored ventilation and respiratory function stable Cardiovascular status: blood pressure returned to baseline Postop Assessment: no apparent nausea or vomiting and spinal receding Anesthetic complications: no   No complications documented.  Kaylyn Layer

## 2019-12-03 NOTE — Lactation Note (Signed)
This note was copied from a baby's chart. Lactation Consultation Note  Patient Name: Alyssa Woodard Today's Date: 12/03/2019  Mom sleeping on arrival.  Dad said he would wake her ip because she needed to wake up anyway.  Dada woke mom up.  Inquired about breastfeeding.  Mom reports she isn't breastfeeding. Inquired about starting to pump and over breastmilk in bottles.  Offered to get mom pumping.  Mom reports she will think about it and went back to sleep.  Urged dad to call lactation as needed.  Left name on white board   Maternal Data    Feeding Feeding Type: Bottle Fed - Formula Nipple Type: Slow - flow  LATCH Score                   Interventions    Lactation Tools Discussed/Used     Consult Status      Alyssa Woodard 12/03/2019, 5:33 PM

## 2019-12-03 NOTE — Progress Notes (Addendum)
Post Partum Day 1  Subjective:  Alyssa Woodard is a 24 y.o. G1P0 [redacted]w[redacted]d s/p primary LTCS for breech presentation.  No acute events overnight.  Pt denies problems with ambulating, voiding or po intake.  She denies nausea or vomiting.  Pain is well controlled.  She has not had flatus. She has not had bowel movement.  Lochia Small.  Plan for birth control is Depo-Provera.  Method of Feeding: breast. Denies SOB or dizziness with ambulation.   Objective: BP 128/69 (BP Location: Left Arm)   Pulse 87   Temp 98.4 F (36.9 C) (Oral)   Resp 18   Ht 5\' 1"  (1.549 m)   Wt (!) 190.5 kg   LMP 03/20/2019 (Exact Date)   SpO2 99%   BMI 79.36 kg/m   Physical Exam:  General: alert, cooperative and no distress Lochia:normal flow Chest: CTAB Heart: RRR no m/r/g Abdomen: +BS, soft, nontender, fundus firm at/below umbilicus, wound vac in place, no leakage around incision site  Uterine Fundus: firm DVT Evaluation: No evidence of DVT seen on physical exam. Extremities: BLE edema  Recent Labs    12/02/19 1550 12/03/19 0533  HGB 10.2* 9.0*  HCT 32.5* 28.4*    Assessment/Plan:  ASSESSMENT: Alyssa Woodard is a 24 y.o. G1P0 [redacted]w[redacted]d pod #1 s/p PLTCS, doing well.   Plan for dc within next 1-2 days  Routine PP care  Circumcision prior to dc  Hgb 10.2>9.0, will order iron    LOS: 1 day   Alyssa Woodard 12/03/2019, 7:56 AM

## 2019-12-03 NOTE — Progress Notes (Addendum)
Went into room to round, get mom up, and do her vitals. Mom sound asleep with baby unswaddled crying in crib. Mom instructed to not let baby go past 4 hours without feeding. Times given. I woke mom up to ask her if she has fed baby but mom groggy stated she has not. Baby very hungry. Diaper had not been checked by mom. I changed the diaper and instructed mom to check baby's diaper. Mom relies on nurse to do everything. Has to be reminded many times to do newborn cares.  

## 2019-12-04 MED ORDER — OXYCODONE-ACETAMINOPHEN 5-325 MG PO TABS: 1.0000 | ORAL_TABLET | ORAL | 0 refills | Status: DC | PRN

## 2019-12-04 MED ORDER — IBUPROFEN 800 MG PO TABS: 800.0000 mg | ORAL_TABLET | Freq: Three times a day (TID) | ORAL | 0 refills | Status: DC

## 2019-12-04 MED ORDER — SENNOSIDES-DOCUSATE SODIUM 8.6-50 MG PO TABS: 2.0000 | ORAL_TABLET | ORAL | 0 refills | Status: DC

## 2019-12-04 MED ORDER — FERROUS SULFATE 325 (65 FE) MG PO TABS: 325.0000 mg | ORAL_TABLET | ORAL | 0 refills | Status: DC

## 2019-12-04 NOTE — Progress Notes (Signed)
CSW received consult for heightened anxiety r/t newborn, presented with developmental delay, and often tearful.  CSW visited Alyssa Woodard at bedside to offer support and assess for needs. On arrival, CSW identified self and provided purpose of visit. Infant Alyssa Woodard was present and being held skin to skin by Alyssa Woodard.  Alyssa Woodard was pleasant and engaged during visit.  Alyssa Woodard reported current mood as "happy, glad he is here, and excited about going home". Alyssa Woodard explained previous tears were related to stomach pain, however, she stated "I'm good now". Alyssa Woodard denied any SI, HI, or domestic violence concerns.   CSW encouraged Alyssa Woodard to request CSW return if anything changes. Alyssa Woodard agreed. CSW identifies no further intervention required/no barriers to discharge at this time.   Toryn Dewalt D. Maximino Cozzolino, MSW, LCSWA Clinical Social Worker [phone] 

## 2019-12-06 ENCOUNTER — Encounter (HOSPITAL_COMMUNITY): Payer: Self-pay | Admitting: Obstetrics and Gynecology

## 2019-12-08 ENCOUNTER — Other Ambulatory Visit: Payer: Self-pay

## 2019-12-08 ENCOUNTER — Inpatient Hospital Stay (HOSPITAL_COMMUNITY)
Admission: AD | Admit: 2019-12-08 | Discharge: 2019-12-08 | Disposition: A | Discharge status: Home / Self Care | Payer: Medicaid Other | Attending: Obstetrics and Gynecology | Admitting: Obstetrics and Gynecology

## 2019-12-08 DIAGNOSIS — Z79899 Other long term (current) drug therapy: Secondary | ICD-10-CM | POA: Insufficient documentation

## 2019-12-08 DIAGNOSIS — Z4801 Encounter for change or removal of surgical wound dressing: Secondary | ICD-10-CM

## 2019-12-08 DIAGNOSIS — Z98891 History of uterine scar from previous surgery: Secondary | ICD-10-CM | POA: Diagnosis not present

## 2019-12-08 DIAGNOSIS — O9089 Other complications of the puerperium, not elsewhere classified: Secondary | ICD-10-CM | POA: Diagnosis present

## 2019-12-08 DIAGNOSIS — Z87891 Personal history of nicotine dependence: Secondary | ICD-10-CM | POA: Diagnosis not present

## 2019-12-08 NOTE — MAU Provider Note (Signed)
History     CSN: [id number]  Arrival date and time: 12/08/19 1243   First Provider Initiated Contact with Patient 12/08/19 1318      Chief Complaint  Patient presents with  . PP checkup   24 y.o. G1P1 s/p primary CS for breech 6 days ago presenting d/t noise coming from dressing pump. A Prevena dressing was placed post-op. Reports the pump has been making a noise for 2 days. Denies fever, pain, or incision concerns.   OB History as of 12/02/2019    Gravida  1   Para      Term      Preterm      AB      Living  0     SAB      TAB      Ectopic      Multiple      Live Births              Past Medical History:  Diagnosis Date  . Medical history non-contributory     Past Surgical History:  Procedure Laterality Date  . CESAREAN SECTION N/A 12/02/2019   Procedure: CESAREAN SECTION;  Surgeon: Catalina Antigua, MD;  Location: [facility] LD ORS;  Service: Obstetrics;  Laterality: N/A;  . NO PAST SURGERIES      Family History  Problem Relation Age of Onset  . Hypertension Mother   . Cancer Maternal Aunt     Social History   Tobacco Use  . Smoking status: Former Smoker    Types: Cigarettes  . Smokeless tobacco: Never Used  . Tobacco comment: none since 04/22/2019  Vaping Use  . Vaping Use: Never used  Substance Use Topics  . Alcohol use: Not Currently    Comment: socially  . Drug use: Not Currently    Allergies: No Known Allergies  Medications Prior to Admission  Medication Sig Dispense Refill Last Dose  . ferrous sulfate 325 (65 FE) MG tablet Take 1 tablet (325 mg total) by mouth every other day. 30 tablet 0   . ibuprofen (ADVIL) 800 MG tablet Take 1 tablet (800 mg total) by mouth every 8 (eight) hours. 30 tablet 0   . oxyCODONE-acetaminophen (PERCOCET/ROXICET) 5-325 MG tablet Take 1-2 tablets by mouth every 4 (four) hours as needed for moderate pain. 28 tablet 0   . Prenatal Vit-Fe Fumarate-FA (PRENATAL COMPLETE) 14-0.4 MG TABS Take 1 tablet by mouth  daily. 60 tablet 0   . senna-docusate (SENOKOT-S) 8.6-50 MG tablet Take 2 tablets by mouth daily. 30 tablet 0     Review of Systems  Constitutional: Negative for fever.  Gastrointestinal: Negative for abdominal pain.   Physical Exam   Blood pressure 132/82, pulse 89, temperature 98.7 F (37.1 C), resp. rate 16, last menstrual period 03/20/2019, SpO2 99 %, unknown if currently breastfeeding.  Physical Exam Vitals and nursing note reviewed. Exam conducted with a chaperone present.  Constitutional:      General: She is not in acute distress. HENT:     Head: Normocephalic.  Pulmonary:     Effort: Pulmonary effort is normal. No respiratory distress.  Abdominal:     General: There is no distension.     Tenderness: There is no abdominal tenderness.     Comments: Tegaderm over dressing not intact along inferior border. Dressing removed. Incision well approximated, no edema, erythema, or drainage  Musculoskeletal:        General: Normal range of motion.  Skin:    General: Skin is warm  and dry.  Neurological:     Mental Status: She is alert.  Psychiatric:        Mood and Affect: Mood normal.    No results found for this or any previous visit (from the past 24 hour(s)).  MAU Course  Procedures  MDM Dressing removed, incision healing well. Pt reassured. Instructed her to keep area dry with clean peripad, change often. Stable for discharge home.   Assessment and Plan   1. Status post cesarean delivery    Discharge home Follow up at [facility] as scheduled Return precautions  Allergies as of 12/08/2019   No Known Allergies     Medication List    TAKE these medications   ferrous sulfate 325 (65 FE) MG tablet Take 1 tablet (325 mg total) by mouth every other day.   ibuprofen 800 MG tablet Commonly known as: ADVIL Take 1 tablet (800 mg total) by mouth every 8 (eight) hours.   oxyCODONE-acetaminophen 5-325 MG tablet Commonly known as: PERCOCET/ROXICET Take 1-2 tablets by  mouth every 4 (four) hours as needed for moderate pain.   Prenatal Complete 14-0.4 MG Tabs Take 1 tablet by mouth daily.   senna-docusate 8.6-50 MG tablet Commonly known as: Senokot-S Take 2 tablets by mouth daily.      Donette Larry, CNM 12/08/2019, 1:19 PM

## 2019-12-08 NOTE — MAU Note (Signed)
.   Alyssa Woodard is a 24 y.o. here in MAU reporting: that she had a C/S for breech presentation on June the 24th and a drain was placed in her incision and she wants to have it removed due to the pump making a noise. Denies any pain   Pain score: 0 Vitals:   12/08/19 1308  BP: 132/82  Pulse: 89  Resp: 16  Temp: 98.7 F (37.1 C)  SpO2: 99%     FHT: Lab orders placed from triage:

## 2019-12-09 ENCOUNTER — Encounter (HOSPITAL_COMMUNITY): Payer: Self-pay | Admitting: Obstetrics and Gynecology

## 2019-12-09 ENCOUNTER — Encounter: Payer: Medicaid Other | Admitting: Obstetrics and Gynecology

## 2019-12-09 NOTE — Transfer of Care (Signed)
Immediate Anesthesia Transfer of Care Note  Patient: Alyssa Woodard  Procedure(s) Performed: CESAREAN SECTION (N/A Abdomen)  Patient Location: PACU  Anesthesia Type:Regional    Level of Consciousness: awake and alert   Airway & Oxygen Therapy: Patient Spontanous Breathing  Post-op Assessment: Report given to RN and Post -op Vital signs reviewed and stable  Post vital signs: Reviewed  Last Vitals:  Vitals Value Taken Time  BP 132/82 12/08/19 1308  Temp 37.1 C 12/08/19 1308  Pulse 89 12/08/19 1308  Resp 16 12/08/19 1308  SpO2 99 % 12/08/19 1308    Last Pain:  Vitals:   12/04/19 0825  TempSrc:   PainSc: 0-No pain         Complications: No complications documented.

## 2021-03-11 IMAGING — US US MFM OB DETAIL+14 WK
1 series · 13 of 28 positions shown · non-contrast
Comparison: none

[Series 1: us mfm ob detail+14 wk · 13 of 71 slices shown]
[im 3/71]
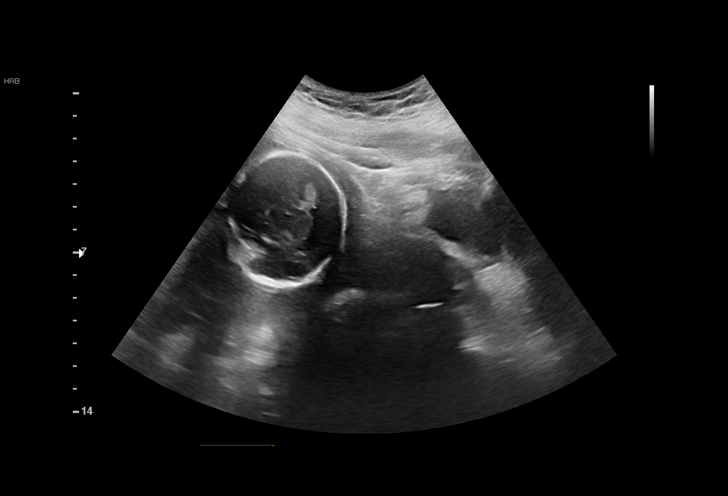
[im 8/71]
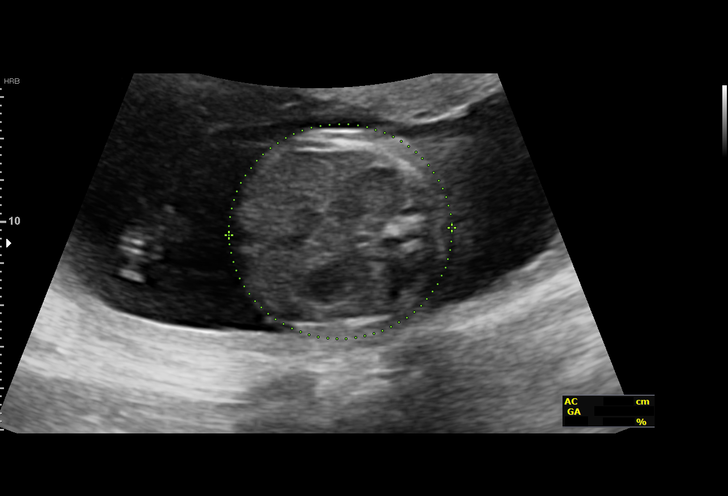
[im 13/71]
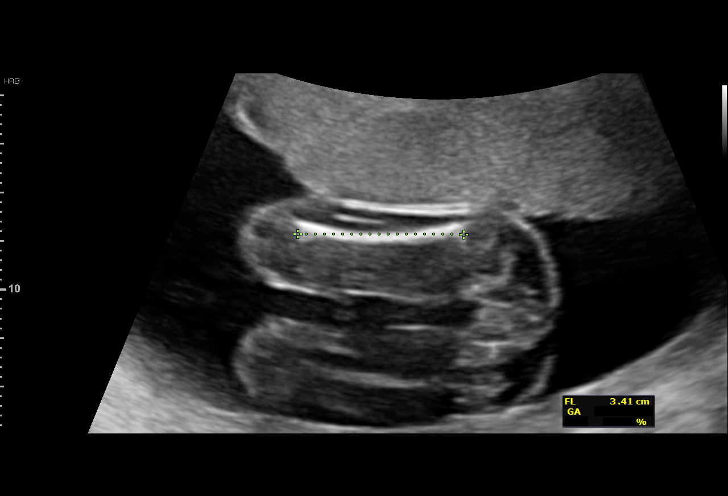
[im 19/71]
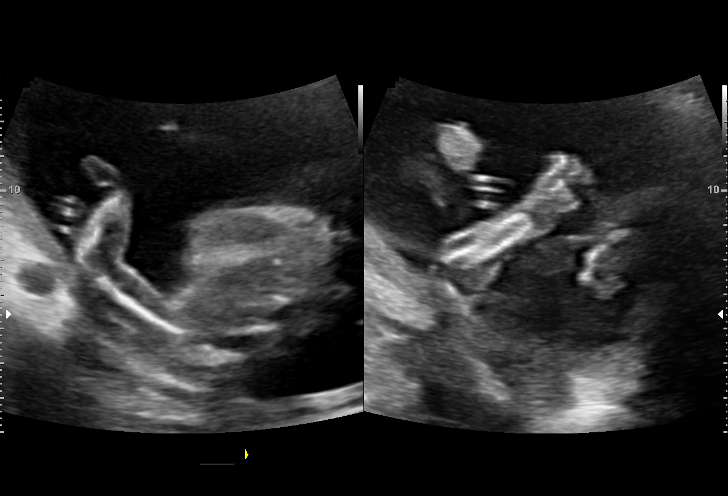
[im 24/71]
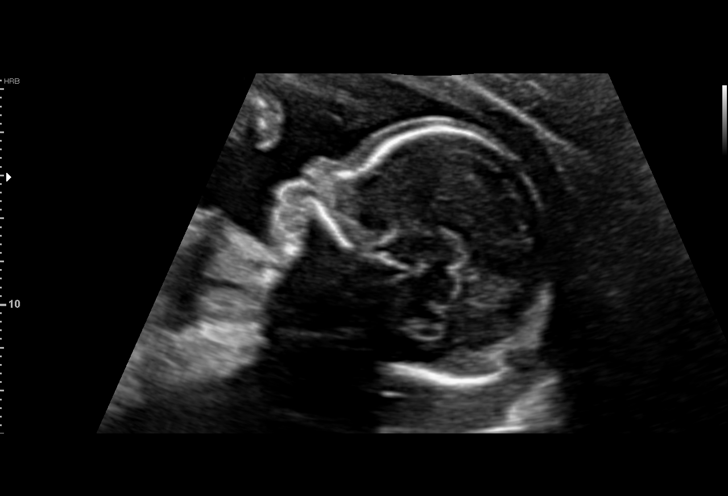
[im 29/71]
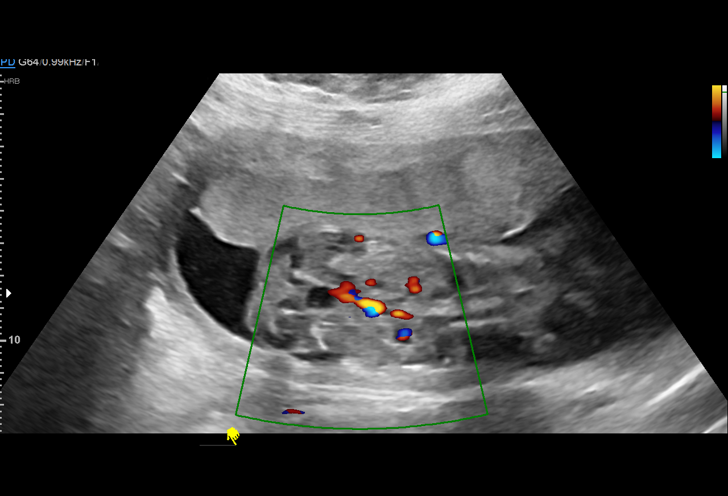
[im 37/71]
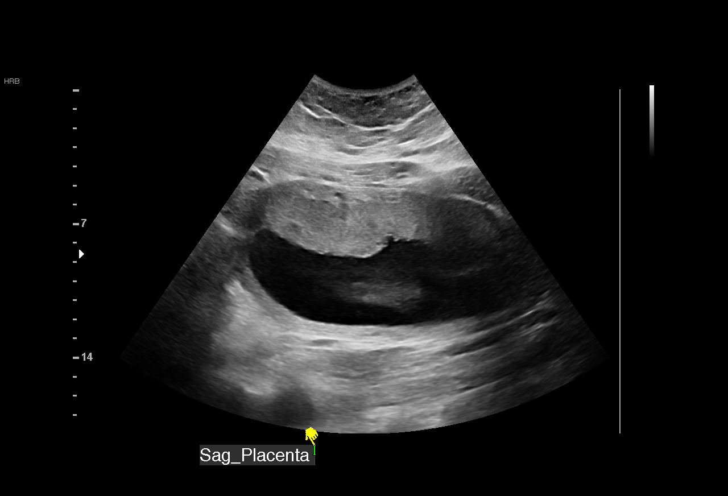
[im 42/71]
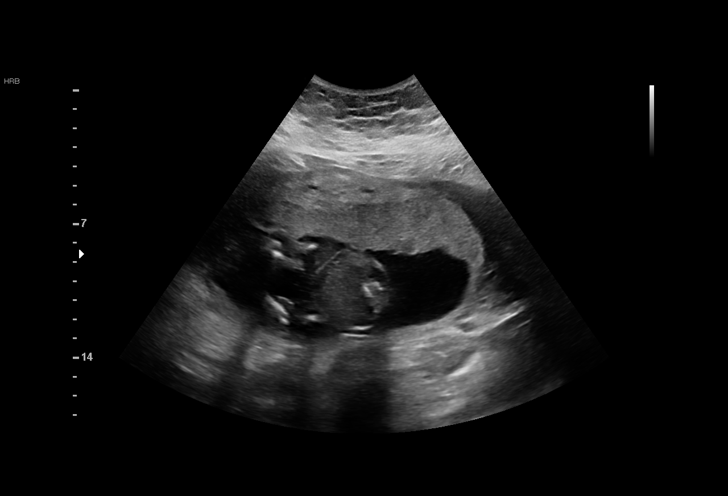
[im 47/71]
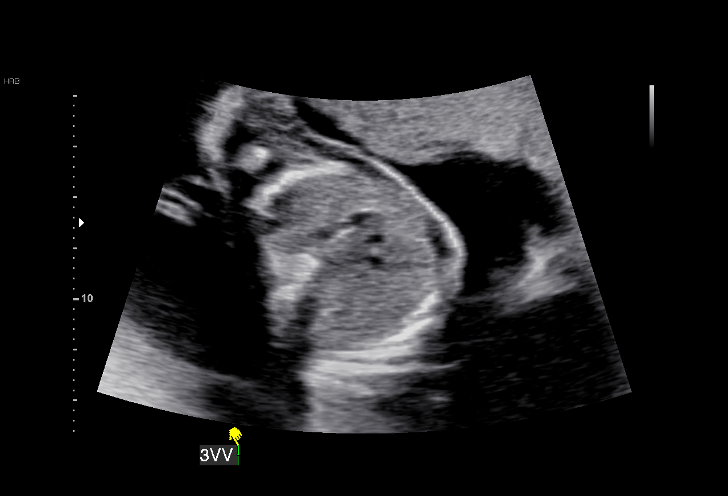
[im 52/71]
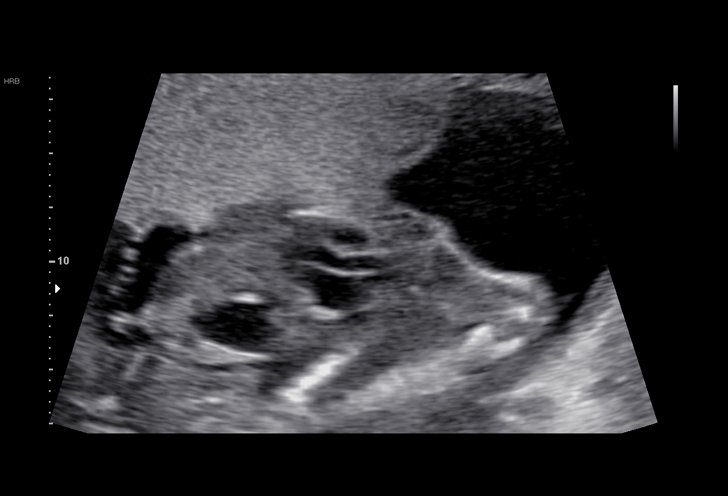
[im 58/71]
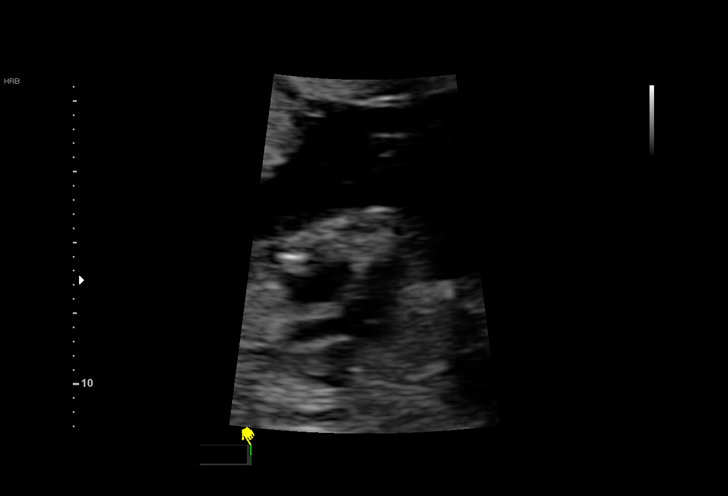
[im 63/71]
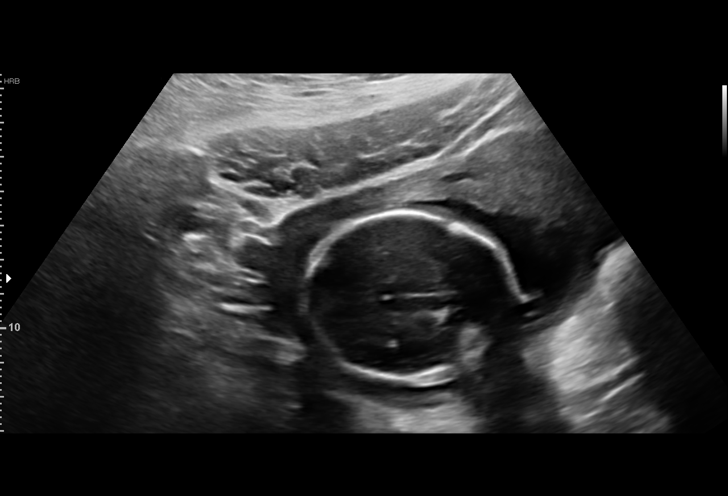
[im 68/71]
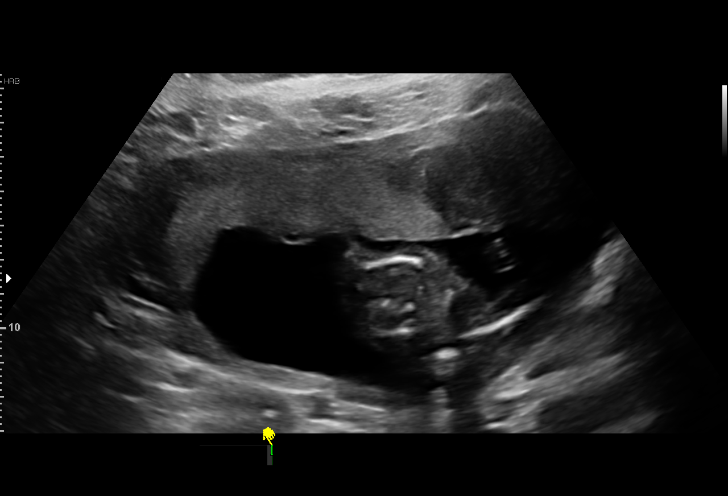

[13 of 28 positions shown; findings below may reference images not displayed]

Health
                   JANNETT NP

 ----------------------------------------------------------------------

 ----------------------------------------------------------------------
Indications

  Maternal morbid obesity (BMI 76)
  Encounter for antenatal screening for
  malformations
  Family history of congenital anomaly (FOB
  heart surgery age 13)
  20 weeks gestation of pregnancy
 ----------------------------------------------------------------------
Vital Signs

 BMI:
Fetal Evaluation

 Num Of Fetuses:         1
 Fetal Heart Rate(bpm):  149
 Cardiac Activity:       Observed
 Presentation:           Cephalic
 Placenta:               Anterior
 P. Cord Insertion:      Visualized, central

 Amniotic Fluid
 AFI FV:      Within normal limits

                             Largest Pocket(cm)

Biometry

 BPD:      48.1  mm     G. Age:  20w 4d         42  %    CI:        73.62   %    70 - 86
                                                         FL/HC:      18.8   %    15.9 -
 HC:      178.1  mm     G. Age:  20w 2d         22  %    HC/AC:      1.07        1.06 -
 AC:      166.5  mm     G. Age:  21w 5d         74  %    FL/BPD:     69.6   %
 FL:       33.5  mm     G. Age:  20w 3d         34  %    FL/AC:      20.1   %    20 - 24
 Est. FW:     394  gm    0 lb 14 oz      63  %
OB History

 Gravidity:    1
Gestational Age

 LMP:           18w 2d        Date:  03/20/19                 EDD:   12/25/19
 U/S Today:     20w 5d                                        EDD:   12/08/19
 Best:          20w 5d     Det. By:  U/S  (06/21/19)          EDD:   12/08/19
Anatomy

 Cranium:               Appears normal         Aortic Arch:            Not well visualized
 Cavum:                 Appears normal         Ductal Arch:            Appears normal
 Ventricles:            Appears normal         Diaphragm:              Appears normal
 Choroid Plexus:        Appears normal         Stomach:                Appears normal, left
                                                                       sided
 Cerebellum:            Appears normal         Abdomen:                Appears normal
 Posterior Fossa:       Appears normal         Abdominal Wall:         Not well visualized
 Nuchal Fold:           Not well visualized    Cord Vessels:           Appears normal (3
                                                                       vessel cord)
 Face:                  Appears normal         Kidneys:                Appear normal
                        (orbits and profile)
 Lips:                  Not well visualized    Bladder:                Appears normal
 Thoracic:              Appears normal         Spine:                  Not well visualized
 Heart:                 Not well visualized    Upper Extremities:      Visualized
 RVOT:                  Appears normal         Lower Extremities:      Visualized
 LVOT:                  Appears normal

 Other:  Technically difficult due to maternal habitus and fetal position.
Cervix Uterus Adnexa

 Cervix
 Length:           4.08  cm.
 Normal appearance by transabdominal scan.
Impression

 We performed fetal anatomy scan. No makers of
 aneuploidies or fetal structural defects are seen. Fetal
 biometry is consistent with her previously-established dates.
 Amniotic fluid is normal and good fetal activity is seen.
 Patient understands the limitations of ultrasound in detecting
 fetal anomalies.

 On cell-free fetal DNA screening, the risks of fetal
 aneuploidies are not increased.

 Her partner (father of the baby) had congenital heart disease
 and had surgery. Patient had consultation with our genetic
 counselor (see separate note). I recommended fetal
 echocardiography.

 Maternal obesity imposes limitations on the resolution of
 images, and failure to detect fetal anomalies is more common
 in obese pregnant women. As maternal obesity makes
 clinical assessment of fetal growth difficult, we recommend
 serial growth scans until delivery.
Recommendations

 -An appointment was made for her to return in 4 weeks for
 completion of fetal anatomy.
 -We have set up an appointment for fetal echocardiography
 ([HOSPITAL], Sopping Scientist).
 -Fetal growth assessments every 4 weeks till delivery.
                 Zafran, Mustafiq

## 2021-04-08 IMAGING — US US MFM OB FOLLOW-UP
1 series · 13 of 28 positions shown · non-contrast
Comparison: none

[Series 1: us mfm ob follow-up · 54 acquisitions, 13 frames shown]
[im 2/54]
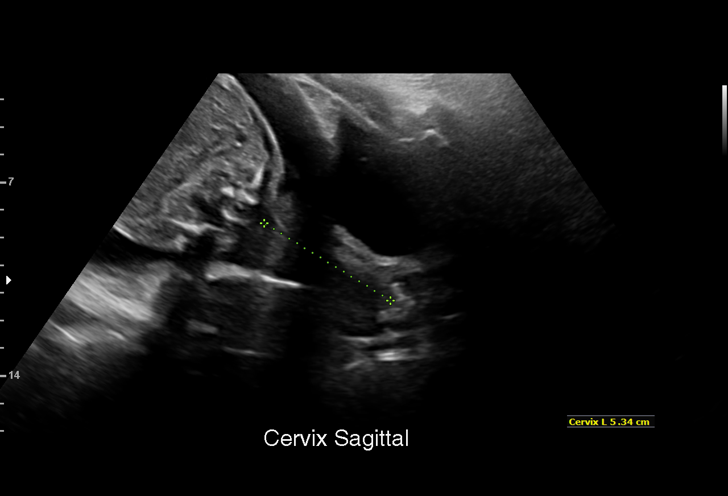
[im 6/54]
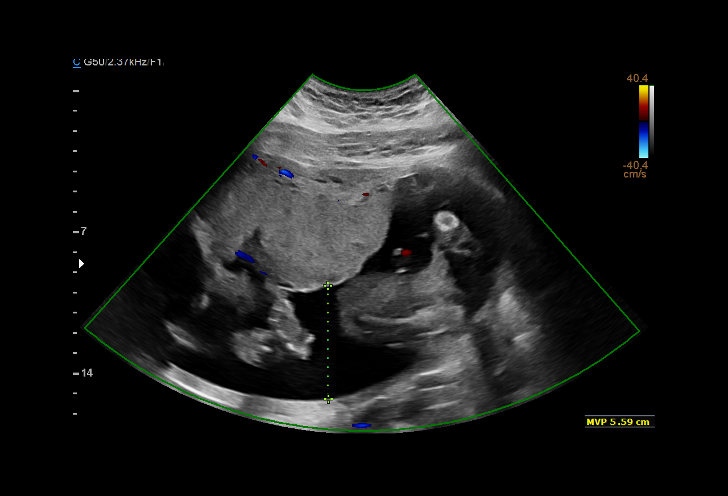
[im 10/54]
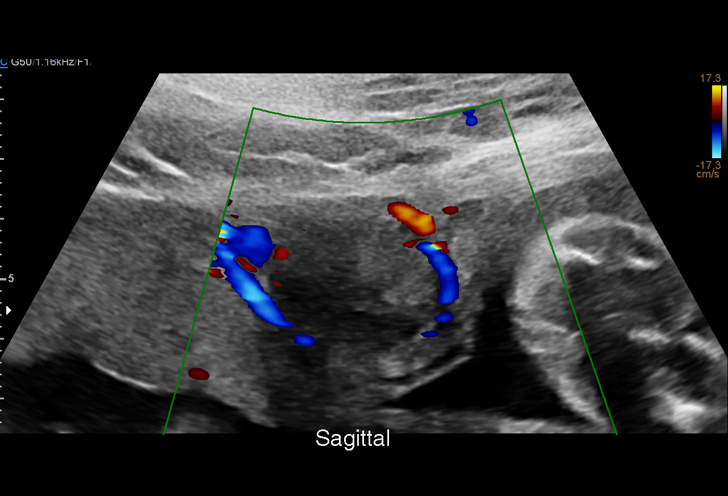
[im 14/54]
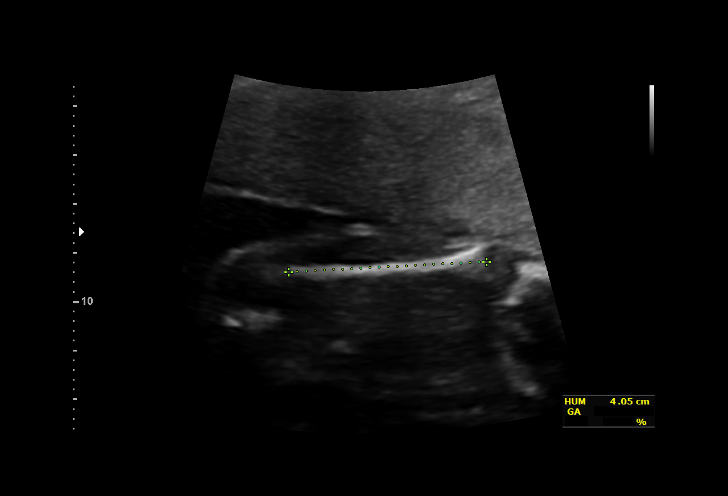
[im 18/54]
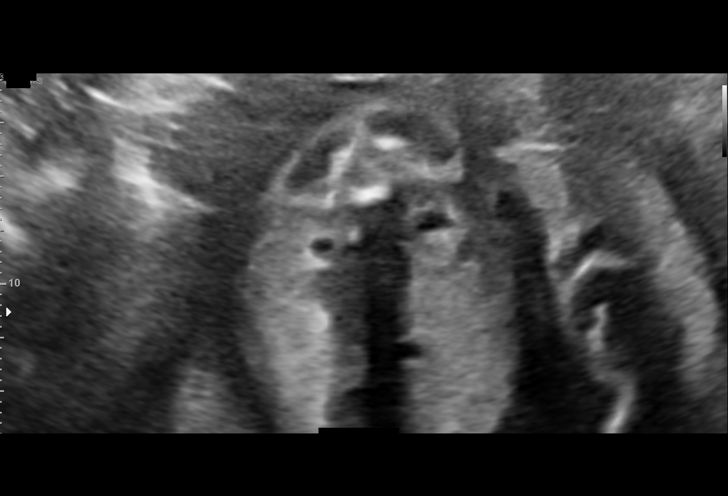
[im 22/54]
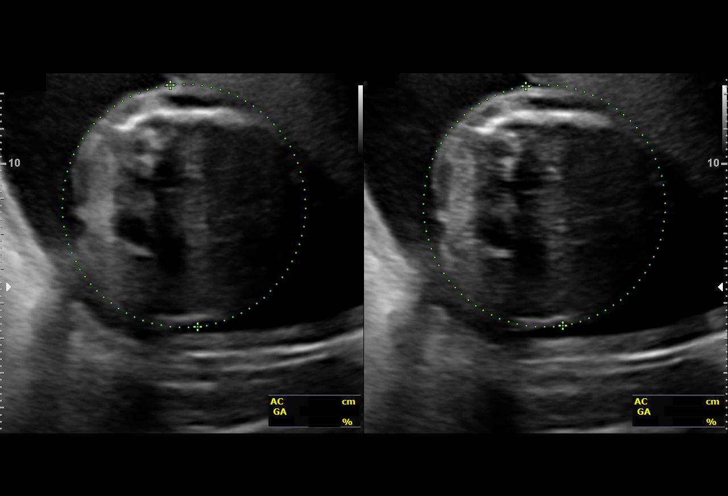
[im 28/54]
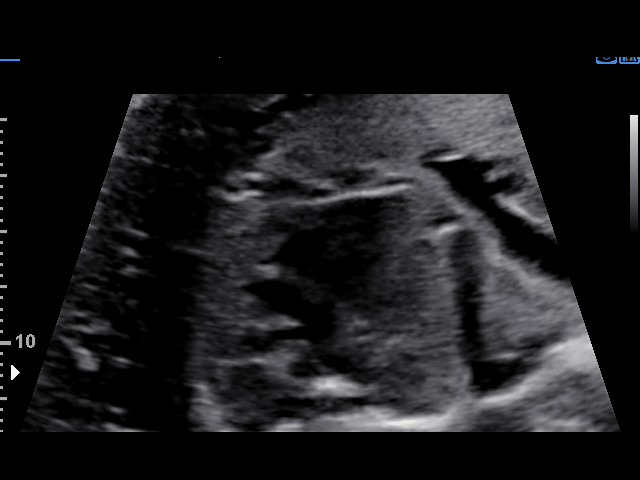
[im 32/54]
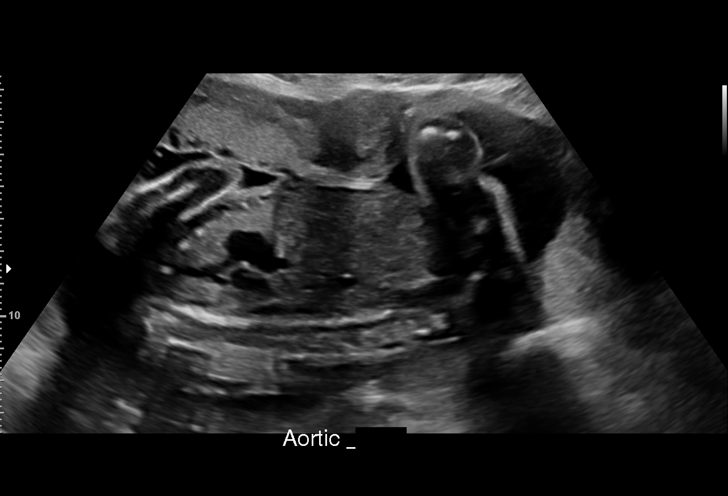
[im 36/54]
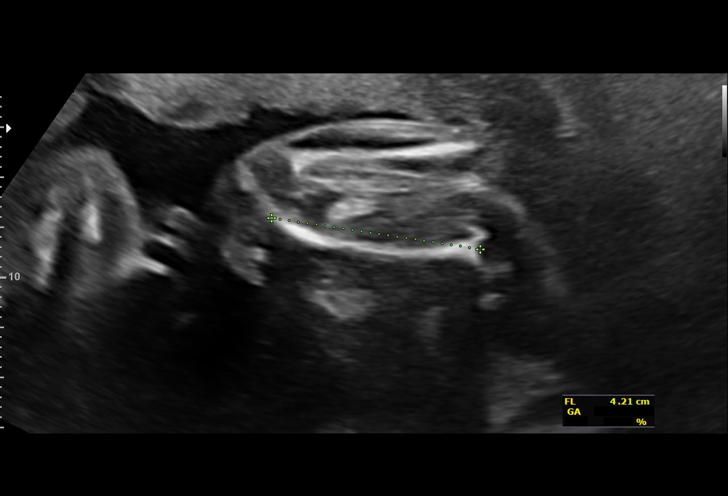
[im 40/54]
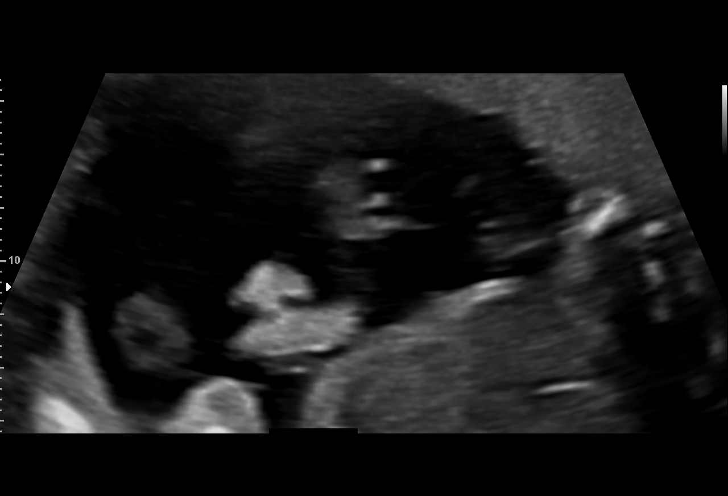
[im 44/54]
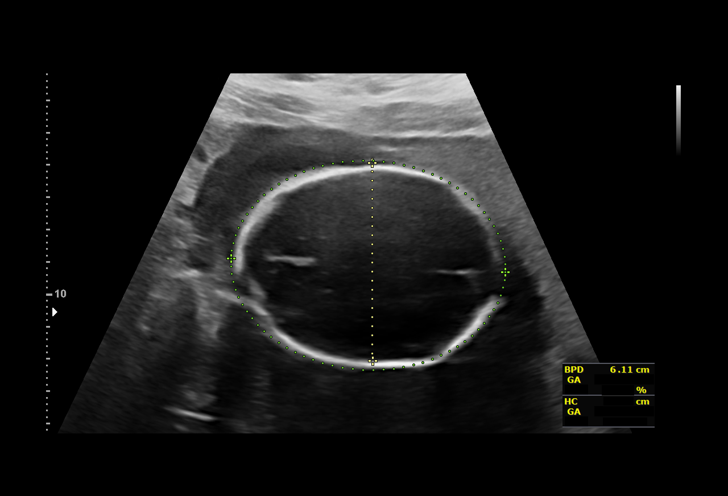
[im 48/54]
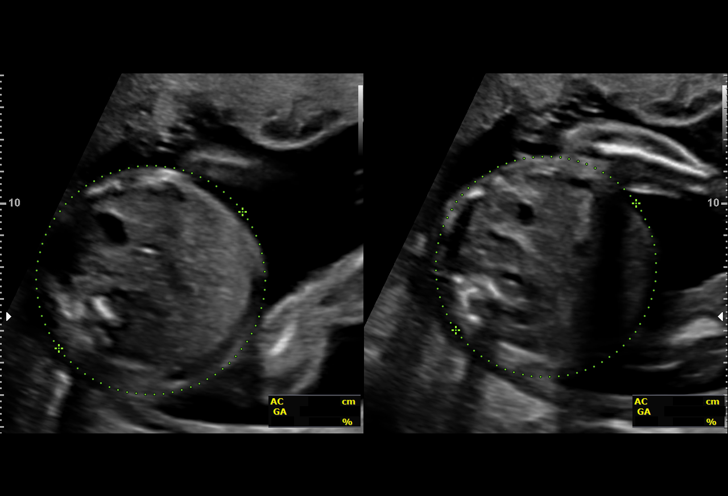
[im 52/54]
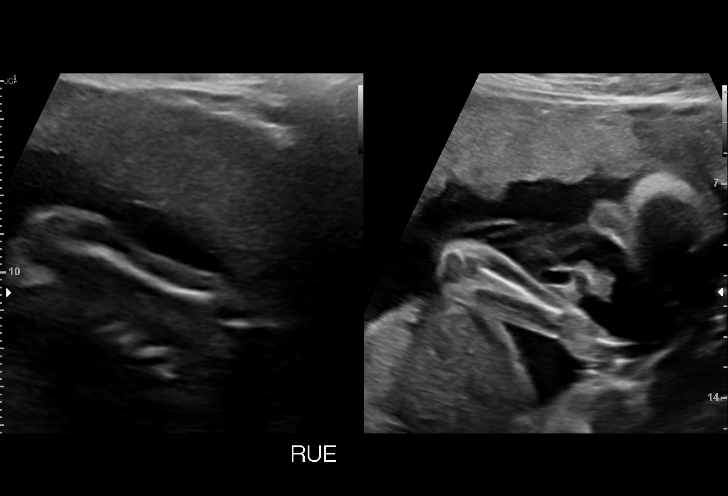

[13 of 28 positions shown; findings below may reference images not displayed]

Health
                   FUNKHOUSER NP

 ----------------------------------------------------------------------

 ----------------------------------------------------------------------
Indications

  Maternal morbid obesity (BMI 76)
  24 weeks gestation of pregnancy
  Encounter for other antenatal screening
  follow-up
  Family history of congenital anomaly (FOB
  heart surgery age 13)
 ----------------------------------------------------------------------
Vital Signs

                                                Height:        5'1"
Fetal Evaluation

 Num Of Fetuses:         1
 Fetal Heart Rate(bpm):  132
 Cardiac Activity:       Observed
 Presentation:           Transverse, head to maternal right
 Placenta:               Anterior
 P. Cord Insertion:      Visualized

 Amniotic Fluid
 AFI FV:      Within normal limits

                             Largest Pocket(cm)

Biometry

 BPD:        61  mm     G. Age:  24w 6d         46  %    CI:        66.03   %    70 - 86
                                                         FL/HC:      16.8   %    18.7 -
 HC:      240.8  mm     G. Age:  26w 1d         80  %    HC/AC:      1.09        1.04 -
 AC:      220.2  mm     G. Age:  26w 3d         89  %    FL/BPD:     66.2   %    71 - 87
 FL:       40.4  mm     G. Age:  23w 0d          4  %    FL/AC:      18.3   %    20 - 24
 HUM:      42.5  mm     G. Age:  25w 4d         62  %
 LV:        4.1  mm

 Est. FW:     774  gm    1 lb 11 oz      60  %
OB History

 Gravidity:    1
Gestational Age

 LMP:           22w 2d        Date:  03/20/19                 EDD:   12/25/19
 U/S Today:     25w 1d                                        EDD:   12/05/19
 Best:          24w 5d     Det. By:  U/S  (06/21/19)          EDD:   12/08/19
Anatomy

 Cranium:               Appears normal         Aortic Arch:            Appears normal
 Cavum:                 Previously seen        Ductal Arch:            Previously seen
 Ventricles:            Previously seen        Diaphragm:              Appears normal
 Choroid Plexus:        Previously seen        Stomach:                Appears normal, left
                                                                       sided
 Cerebellum:            Previously seen        Abdomen:                Appears normal
 Posterior Fossa:       Previously seen        Abdominal Wall:         Appears nml (cord
                                                                       insert, abd wall)
 Nuchal Fold:           Not applicable (>20    Cord Vessels:           Previously seen
                        wks GA)
 Face:                  Orbits and profile     Kidneys:                Appear normal
                        previously seen
 Lips:                  Appears normal         Bladder:                Appears normal
 Thoracic:              Appears normal         Spine:                  Limited views
                                                                       previously seen
 Heart:                 Not well visualized    Upper Extremities:      Appears normal
 RVOT:                  Previously seen        Lower Extremities:      Appears normal
 LVOT:                  Previously seen

 Other:  Technically difficult due to maternal habitus and fetal position.
Cervix Uterus Adnexa

 Cervix
 Length:           5.34  cm.
 Normal appearance by transabdominal scan.
Comments

 This patient was seen for a follow up growth scan due to
 morbid obesity. The patient reports that she had a normal
 fetal echocardiogram performed at [HOSPITAL] pediatric cardiology.
 She denies any problems since her last exam.
 She was informed that the fetal growth and amniotic fluid
 level appears appropriate for her gestational age.
 Due to maternal obesity, a follow up exam was scheduled in
 4 weeks.

## 2021-06-05 IMAGING — US US MFM OB FOLLOW-UP
1 series · 14 of 28 positions shown · non-contrast
Comparison: none

[Series 1: us mfm ob follow-up · 51 acquisitions, 14 frames shown]
[im 2/51]
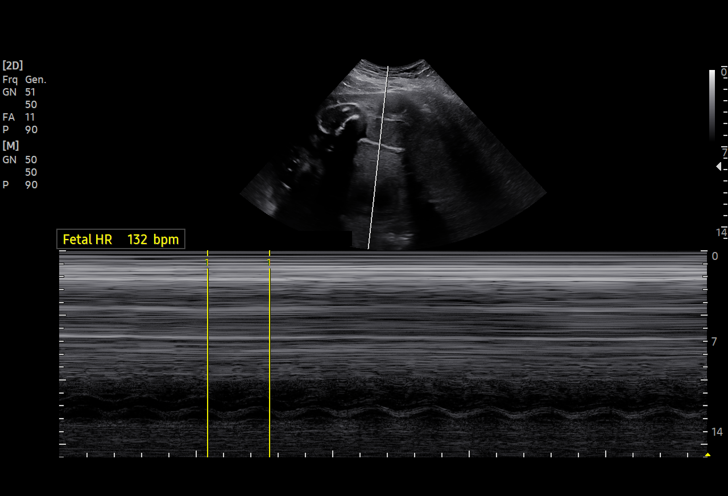
[im 6/51]
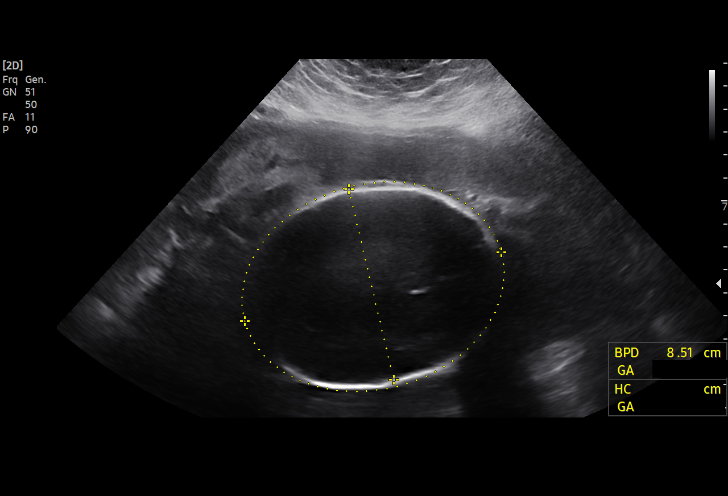
[im 10/51]
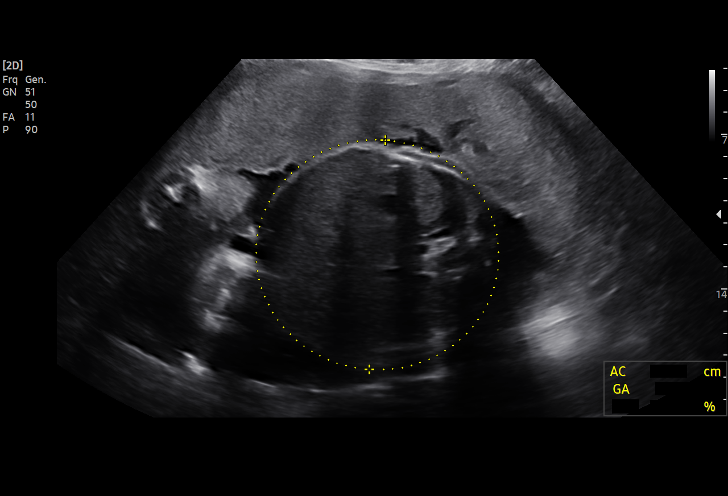
[im 13/51]
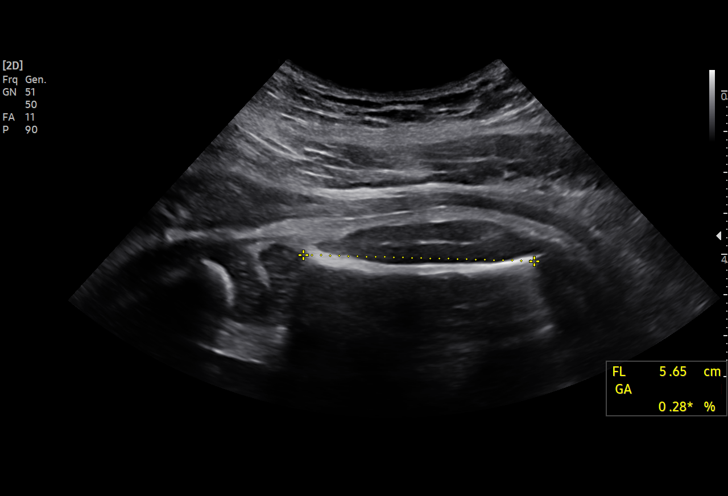
[im 17/51]
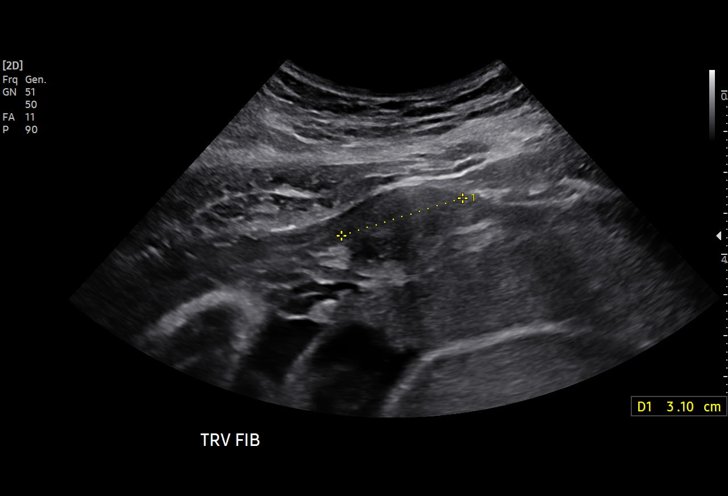
[im 21/51]
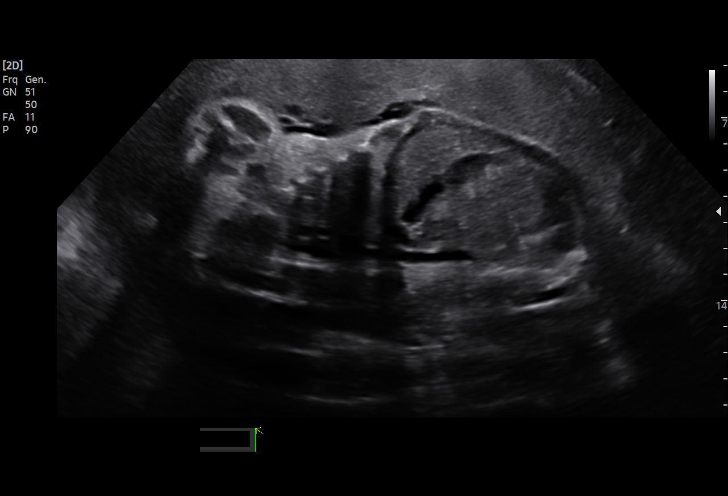
[im 25/51]
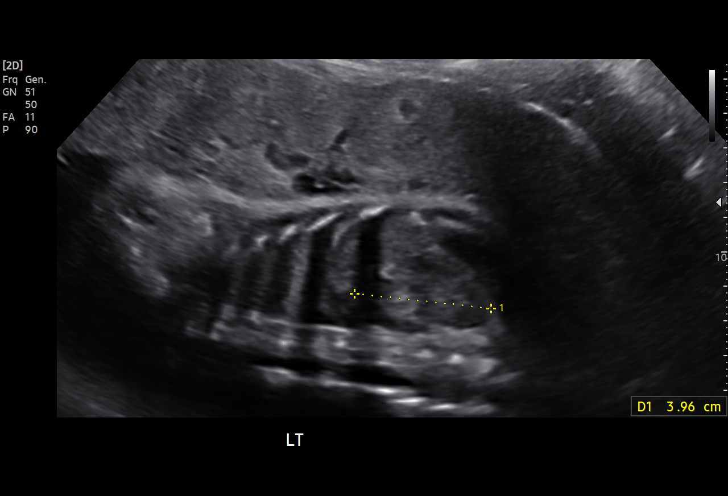
[im 28/51]
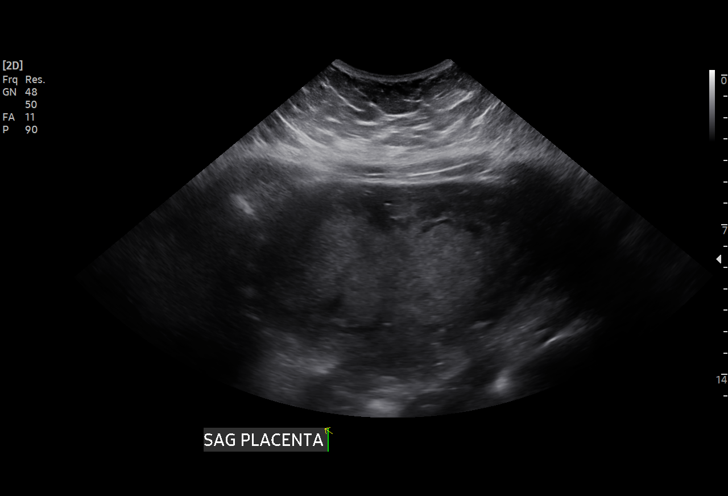
[im 32/51]
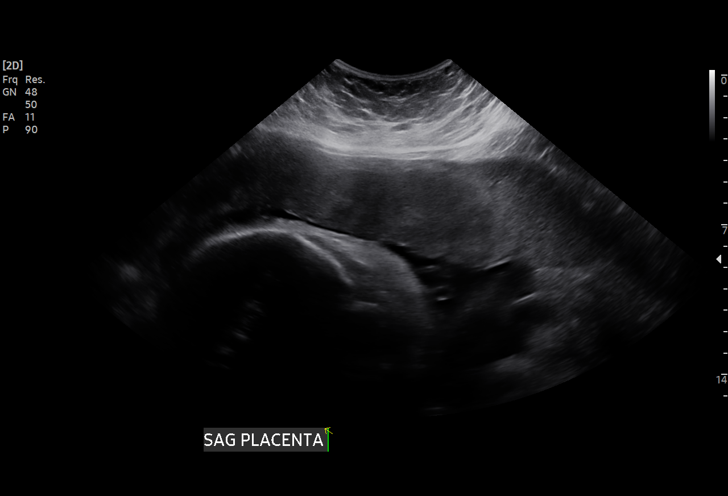
[im 36/51]
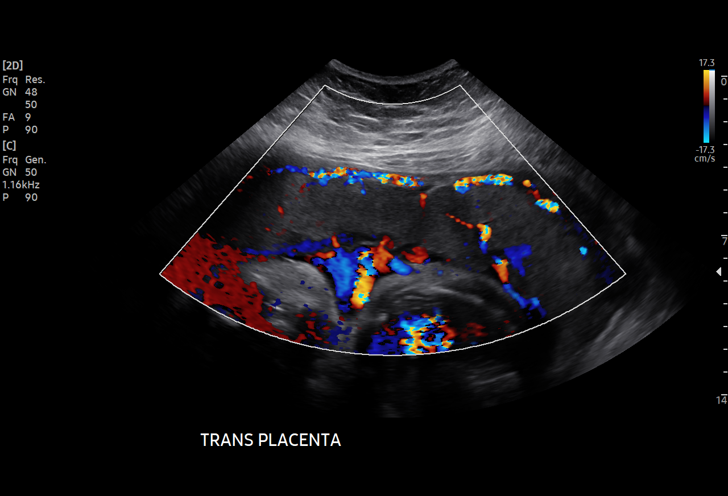
[im 39/51]
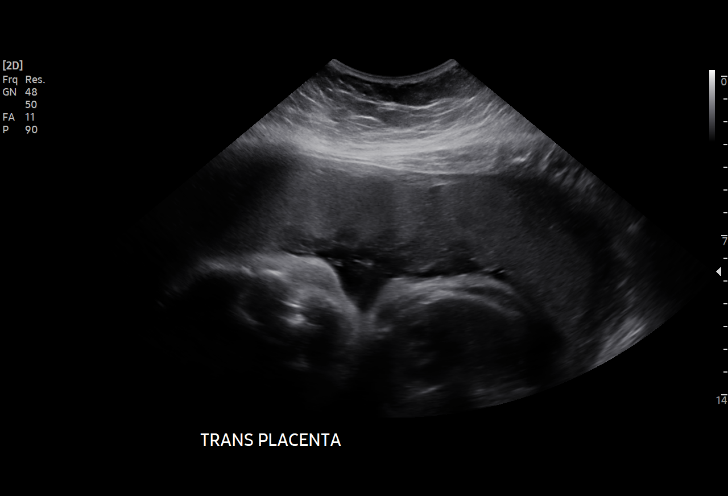
[im 43/51]
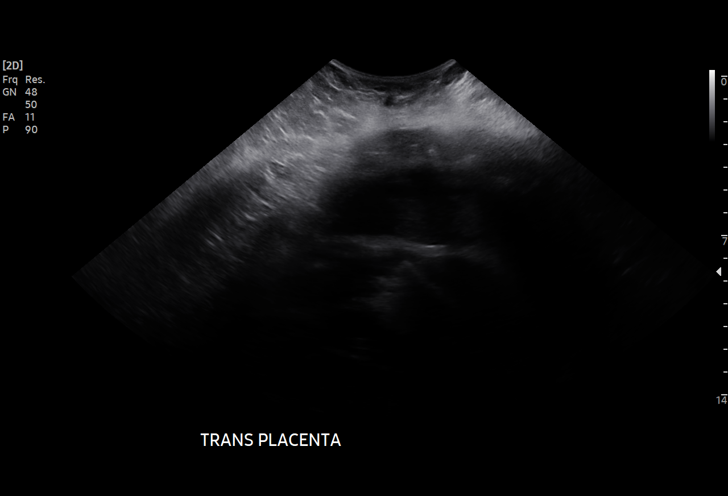
[im 47/51]
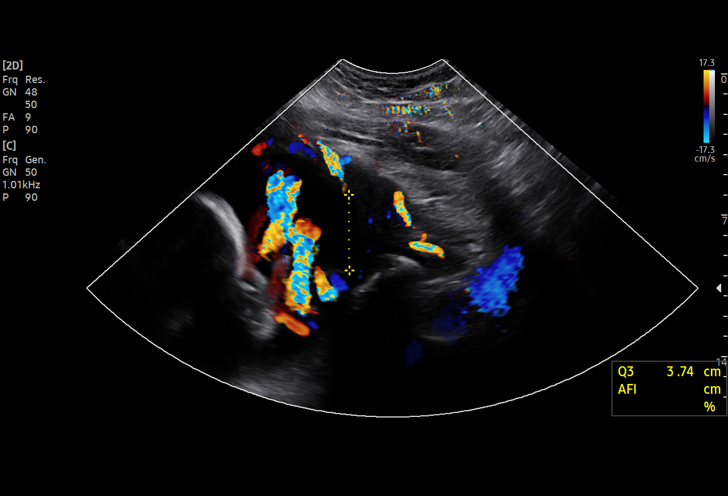
[im 51/51]
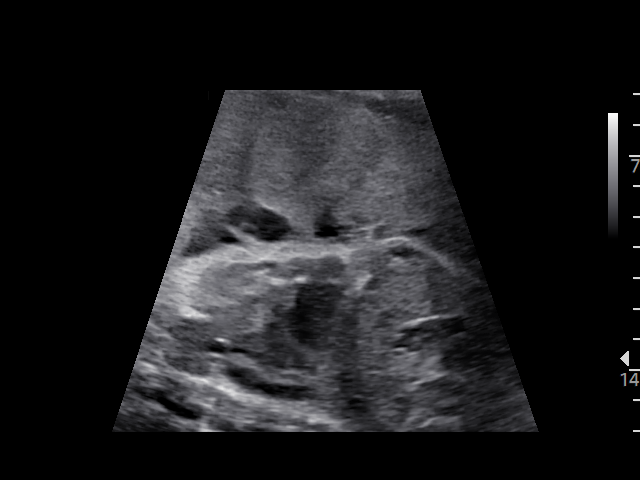

[14 of 28 positions shown; findings below may reference images not displayed]

Health
                   CANCEL NP

Indications

 33 weeks gestation of pregnancy
 Maternal morbid obesity (BMI 76)
 Encounter for other antenatal screening
 follow-up
 Family history of congenital anomaly (FOB
 heart surgery age 13)
Vital Signs

                                                Height:        5'1"
Fetal Evaluation

 Num Of Fetuses:         1
 Fetal Heart Rate(bpm):  132
 Cardiac Activity:       Observed
 Presentation:           Breech
 Placenta:               Anterior
 P. Cord Insertion:      Visualized, central

 Amniotic Fluid
 AFI FV:      Within normal limits

 AFI Sum(cm)     %Tile       Largest Pocket(cm)
 19.1            71

 RUQ(cm)       RLQ(cm)       LUQ(cm)        LLQ(cm)
 6.9           4.5           4
Biometry

 BPD:      85.4  mm     G. Age:  34w 3d         83  %    CI:        69.07   %    70 - 86
                                                         FL/HC:      16.9   %    19.9 -
 HC:      328.2  mm     G. Age:  37w 2d         98  %    HC/AC:      0.97        0.96 -
 AC:      338.2  mm     G. Age:  37w 5d       > 99  %    FL/BPD:     65.1   %    71 - 87
 FL:       55.6  mm     G. Age:  29w 2d        < 1  %    FL/AC:      16.4   %    20 - 24
 HUM:        49  mm     G. Age:  28w 6d        < 5  %

 Est. FW:    5473  gm    5 lb 13 oz      96  %
OB History

 Gravidity:    1
Gestational Age

 LMP:           30w 4d        Date:  03/20/19                 EDD:   12/25/19
 U/S Today:     34w 5d                                        EDD:   11/26/19
 Best:          33w 0d     Det. By:  U/S  (06/21/19)          EDD:   12/08/19
Anatomy

 Cranium:               Appears normal         Aortic Arch:            Previously seen
 Cavum:                 Appears normal         Ductal Arch:            Previously seen
 Ventricles:            Previously seen        Diaphragm:              Appears normal
 Choroid Plexus:        Previously seen        Stomach:                Appears normal, left
                                                                       sided
 Cerebellum:            Previously seen        Abdomen:                Appears normal
 Posterior Fossa:       Previously seen        Abdominal Wall:         Previously seen
 Nuchal Fold:           Not applicable (>20    Cord Vessels:           Previously seen
                        wks GA)
 Face:                  Orbits and profile     Kidneys:                Appear normal
                        previously seen
 Lips:                  Previously seen        Bladder:                Appears normal
 Thoracic:              Appears normal         Spine:                  Limited views
                                                                       previously seen
 Heart:                 Previously seen        Upper Extremities:      Previously seen
 RVOT:                  Previously seen        Lower Extremities:      Previously seen
 LVOT:                  Appears normal

 Other:  Technically difficult due to maternal habitus and fetal position.
Myomas

 Site                     L(cm)      W(cm)      D(cm)       Location
 Anterior                 3.1        2.1        3.1         Intramural

 Blood Flow                  RI       PI       Comments

Impression

 Maternal obesity.

 BP at our office: 134/81 mm Hg .

 Amniotic fluid is normal and good fetal activity is seen .The
 estimated fetal weight is at the 96th percentile. AC measures
 at greater than the 99th percentile. A small anterior intramural
 myoma is seen (measurements above).
Recommendations

 -An appointment was made for her to return in 4 weeks for
 fetal growth assessment.
                 Gcab, Famerito

## 2021-06-18 ENCOUNTER — Ambulatory Visit (HOSPITAL_COMMUNITY)
Admission: EM | Admit: 2021-06-18 | Discharge: 2021-06-18 | Disposition: A | Discharge status: Home / Self Care | Payer: Medicaid Other | Attending: Physician Assistant | Admitting: Physician Assistant

## 2021-06-18 ENCOUNTER — Encounter (HOSPITAL_COMMUNITY): Payer: Self-pay

## 2021-06-18 ENCOUNTER — Other Ambulatory Visit: Payer: Self-pay

## 2021-06-18 ENCOUNTER — Telehealth (HOSPITAL_COMMUNITY): Payer: Self-pay

## 2021-06-18 DIAGNOSIS — R112 Nausea with vomiting, unspecified: Secondary | ICD-10-CM | POA: Diagnosis present

## 2021-06-18 DIAGNOSIS — R1011 Right upper quadrant pain: Secondary | ICD-10-CM | POA: Diagnosis present

## 2021-06-18 LAB — CBC WITH DIFFERENTIAL/PLATELET
Abs Immature Granulocytes: 0.02 10*3/uL (ref 0.00–0.07)
Basophils Absolute: 0 10*3/uL (ref 0.0–0.1)
Basophils Relative: 0 %
Eosinophils Absolute: 0.2 10*3/uL (ref 0.0–0.5)
Eosinophils Relative: 2 %
HCT: 36.9 % (ref 36.0–46.0)
Hemoglobin: 11.6 g/dL — ABNORMAL LOW (ref 12.0–15.0)
Immature Granulocytes: 0 %
Lymphocytes Relative: 37 %
Lymphs Abs: 4 10*3/uL (ref 0.7–4.0)
MCH: 28.5 pg (ref 26.0–34.0)
MCHC: 31.4 g/dL (ref 30.0–36.0)
MCV: 90.7 fL (ref 80.0–100.0)
Monocytes Absolute: 0.8 10*3/uL (ref 0.1–1.0)
Monocytes Relative: 8 %
Neutro Abs: 5.6 10*3/uL (ref 1.7–7.7)
Neutrophils Relative %: 53 %
Platelets: 393 10*3/uL (ref 150–400)
RBC: 4.07 MIL/uL (ref 3.87–5.11)
RDW: 14.4 % (ref 11.5–15.5)
WBC: 10.7 10*3/uL — ABNORMAL HIGH (ref 4.0–10.5)
nRBC: 0 % (ref 0.0–0.2)

## 2021-06-18 LAB — COMPREHENSIVE METABOLIC PANEL
ALT: 15 U/L (ref 0–44)
AST: 19 U/L (ref 15–41)
Albumin: 3.8 g/dL (ref 3.5–5.0)
Alkaline Phosphatase: 56 U/L (ref 38–126)
Anion gap: 5 (ref 5–15)
BUN: 12 mg/dL (ref 6–20)
CO2: 26 mmol/L (ref 22–32)
Calcium: 8.8 mg/dL — ABNORMAL LOW (ref 8.9–10.3)
Chloride: 107 mmol/L (ref 98–111)
Creatinine, Ser: 0.78 mg/dL (ref 0.44–1.00)
GFR, Estimated: >60 mL/min (ref >60)
Glucose, Bld: 86 mg/dL (ref 70–99)
Potassium: 4 mmol/L (ref 3.5–5.1)
Sodium: 138 mmol/L (ref 135–145)
Total Bilirubin: 0.6 mg/dL (ref 0.3–1.2)
Total Protein: 7.1 g/dL (ref 6.5–8.1)

## 2021-06-18 LAB — LIPASE, BLOOD: Lipase: 20 U/L (ref 11–51)

## 2021-06-18 MED ORDER — ONDANSETRON 4 MG PO TBDP: 4.0000 mg | ORAL_TABLET | Freq: Three times a day (TID) | ORAL | 0 refills | Status: DC | PRN

## 2021-06-18 NOTE — ED Triage Notes (Signed)
Pt c/o RUQ pain radiating through upper back worse after eating x2 episodes.

## 2021-06-18 NOTE — Discharge Instructions (Signed)
I am concerned about your gallbladder.  Use Zofran every 8 hours as needed for nausea and vomiting.  If you have recurrent symptoms including severe pain, fever, yellowing of your skin or eyes, nausea/vomiting interfering with oral intake, confusion, lightheadedness you need to go to the emergency room immediately as we discussed.  Please follow-up with GI specialist as soon as possible; call to schedule an appointment as soon as you leave here.

## 2021-06-18 NOTE — ED Provider Notes (Signed)
[facility]    CSN: [id number] Arrival date & time: 06/18/21  1040      History   Chief Complaint Chief Complaint  Patient presents with   Abdominal Pain    HPI Alyssa Woodard is a 26 y.o. female.   Patient presents today with a several week history of intermittent right upper quadrant pain.  Reports symptoms were worse last night and rated 10 on a 0-10 pain scale severe enough to prevent her from sleeping.  Currently pain is rated 3/4 in a certain pain scale, localized to right upper quadrant with radiation into epigastrium, described as aching, no aggravating relieving factors identified.  She does report his symptoms are worse following meals but has not identified any specific triggers.  She reports her only abdominal surgery was a C-section and still has her gallbladder.  She reports an episode of nausea and vomiting immediately following pain but denies any ongoing symptoms.  She is eating and drinking normally today.  She has not tried any over-the-counter medication for symptom management.  She denies any urinary symptoms including frequency, urgency, hematuria, flank pain.   Past Medical History:  Diagnosis Date   Medical history non-contributory     Patient Active Problem List   Diagnosis Date Noted   Breech presentation 12/02/2019   Morbid obesity with BMI of 70 and over, adult ([place]) 12/02/2019   [redacted] weeks gestation of pregnancy 12/02/2019   S/P cesarean section 12/02/2019    Past Surgical History:  Procedure Laterality Date   CESAREAN SECTION N/A 12/02/2019   Procedure: CESAREAN SECTION;  Surgeon: Mora Bellman, MD;  Location: [facility];  Service: Obstetrics;  Laterality: N/A;   NO PAST SURGERIES      OB History     Gravida  1   Para  1   Term  1   Preterm      AB      Living  1      SAB      IAB      Ectopic      Multiple  0   Live Births  1            Home Medications    Prior to Admission medications   Medication  Sig Start Date End Date Taking? Authorizing Provider  ondansetron (ZOFRAN-ODT) 4 MG disintegrating tablet Take 1 tablet (4 mg total) by mouth every 8 (eight) hours as needed for nausea or vomiting. 06/18/21  Yes Arria Naim, Derry Skill, PA-C    Family History Family History  Problem Relation Age of Onset   Hypertension Mother    Cancer Maternal Aunt     Social History Social History   Tobacco Use   Smoking status: Former    Types: Cigarettes   Smokeless tobacco: Never   Tobacco comments:    none since 04/22/2019  Vaping Use   Vaping Use: Never used  Substance Use Topics   Alcohol use: Not Currently    Comment: socially   Drug use: Not Currently     Allergies   Patient has no known allergies.   Review of Systems Review of Systems  Constitutional:  Positive for activity change. Negative for appetite change, fatigue and fever.  Respiratory:  Negative for cough and shortness of breath.   Cardiovascular:  Negative for chest pain.  Gastrointestinal:  Positive for abdominal pain, nausea and vomiting. Negative for diarrhea.  Genitourinary:  Negative for dysuria, frequency, pelvic pain, urgency, vaginal bleeding, vaginal discharge and  vaginal pain.  Musculoskeletal:  Positive for back pain. Negative for arthralgias and myalgias.    Physical Exam Triage Vital Signs ED Triage Vitals  Enc Vitals Group     BP 06/18/21 1208 (!) 156/99     Pulse Rate 06/18/21 1208 (!) 107     Resp 06/18/21 1208 18     Temp 06/18/21 1208 98.4 F (36.9 C)     Temp Source 06/18/21 1208 Oral     SpO2 06/18/21 1208 97 %     Weight --      Height --      Head Circumference --      Peak Flow --      Pain Score 06/18/21 1209 4     Pain Loc --      Pain Edu? --      Excl. in Pink? --    No data found.  Updated Vital Signs BP (!) 156/99 (BP Location: Left Arm)    Pulse (!) 107    Temp 98.4 F (36.9 C) (Oral)    Resp 18    SpO2 97%    Breastfeeding No   Visual Acuity Right Eye Distance:   Left Eye  Distance:   Bilateral Distance:    Right Eye Near:   Left Eye Near:    Bilateral Near:     Physical Exam Vitals reviewed.  Constitutional:      General: She is awake. She is not in acute distress.    Appearance: Normal appearance. She is well-developed. She is not ill-appearing.     Comments: Very pleasant female appears stated age in no acute distress sitting comfortably on exam table  HENT:     Head: Normocephalic and atraumatic.  Cardiovascular:     Rate and Rhythm: Normal rate and regular rhythm.     Heart sounds: Normal heart sounds, S1 normal and S2 normal. No murmur heard. Pulmonary:     Effort: Pulmonary effort is normal.     Breath sounds: Normal breath sounds. No wheezing, rhonchi or rales.     Comments: Clear to auscultation bilaterally Abdominal:     General: Bowel sounds are normal.     Palpations: Abdomen is soft.     Tenderness: There is abdominal tenderness in the right upper quadrant and epigastric area. There is no right CVA tenderness, left CVA tenderness, guarding or rebound. Negative signs include Murphy's sign.     Comments: No evidence of acute abdomen on physical exam.  Psychiatric:        Behavior: Behavior is cooperative.     UC Treatments / Results  Labs (all labs ordered are listed, but only abnormal results are displayed) Labs Reviewed  CBC WITH DIFFERENTIAL/PLATELET  COMPREHENSIVE METABOLIC PANEL  LIPASE, BLOOD    EKG   Radiology No results found.  Procedures Procedures (including critical care time)  Medications Ordered in UC Medications - No data to display  Initial Impression / Assessment and Plan / UC Course  I have reviewed the triage vital signs and the nursing notes.  Pertinent labs & imaging results that were available during my care of the patient were reviewed by me and considered in my medical decision making (see chart for details).     Vital signs and physical exam are reassuring today; no indication for emergent  evaluation or imaging.  She was given Zofran to help manage nausea and vomiting.  Discussed that we do not have imaging capabilities in urgent care and if she has recurrence  of pain she needs to go to the emergency room.  CBC, CMP, lipase obtained today-results pending.  Recommend she avoid fatty foods and eat a bland diet.  She is to follow-up ASAP with GI specialist and was given contact information for local provider with instruction to call them to schedule an appointment as soon as possible.  Discussed that if she has any recurrent symptoms including severe pain, fever, yellowing of her skin/eyes, lightheadedness, confusion, nausea/vomiting interfering with oral intake she needs to go directly to the emergency room.  Strict return precautions given to which she expressed understanding.  Final Clinical Impressions(s) / UC Diagnoses   Final diagnoses:  RUQ pain  Nausea and vomiting, unspecified vomiting type     Discharge Instructions      I am concerned about your gallbladder.  Use Zofran every 8 hours as needed for nausea and vomiting.  If you have recurrent symptoms including severe pain, fever, yellowing of your skin or eyes, nausea/vomiting interfering with oral intake, confusion, lightheadedness you need to go to the emergency room immediately as we discussed.  Please follow-up with GI specialist as soon as possible; call to schedule an appointment as soon as you leave here.     ED Prescriptions     Medication Sig Dispense Auth. Provider   ondansetron (ZOFRAN-ODT) 4 MG disintegrating tablet Take 1 tablet (4 mg total) by mouth every 8 (eight) hours as needed for nausea or vomiting. 20 tablet Sumiya Mamaril, Derry Skill, PA-C      PDMP not reviewed this encounter.   Terrilee Croak, PA-C 06/18/21 1239

## 2021-07-04 ENCOUNTER — Ambulatory Visit: Payer: Medicaid Other | Admitting: Physician Assistant

## 2021-07-09 ENCOUNTER — Emergency Department (HOSPITAL_COMMUNITY): Payer: Medicaid Other

## 2021-07-09 ENCOUNTER — Emergency Department (HOSPITAL_COMMUNITY)
Admission: EM | Admit: 2021-07-09 | Discharge: 2021-07-09 | Disposition: A | Discharge status: Home / Self Care | Payer: Medicaid Other | Attending: Emergency Medicine | Admitting: Emergency Medicine

## 2021-07-09 ENCOUNTER — Encounter (HOSPITAL_COMMUNITY): Payer: Self-pay | Admitting: Emergency Medicine

## 2021-07-09 DIAGNOSIS — R1011 Right upper quadrant pain: Secondary | ICD-10-CM

## 2021-07-09 DIAGNOSIS — K8020 Calculus of gallbladder without cholecystitis without obstruction: Secondary | ICD-10-CM | POA: Insufficient documentation

## 2021-07-09 DIAGNOSIS — N9489 Other specified conditions associated with female genital organs and menstrual cycle: Secondary | ICD-10-CM | POA: Insufficient documentation

## 2021-07-09 LAB — CBC
HCT: 35.4 % — ABNORMAL LOW (ref 36.0–46.0)
Hemoglobin: 10.9 g/dL — ABNORMAL LOW (ref 12.0–15.0)
MCH: 28.5 pg (ref 26.0–34.0)
MCHC: 30.8 g/dL (ref 30.0–36.0)
MCV: 92.4 fL (ref 80.0–100.0)
Platelets: 310 10*3/uL (ref 150–400)
RBC: 3.83 MIL/uL — ABNORMAL LOW (ref 3.87–5.11)
RDW: 14.3 % (ref 11.5–15.5)
WBC: 9.5 10*3/uL (ref 4.0–10.5)
nRBC: 0 % (ref 0.0–0.2)

## 2021-07-09 LAB — URINALYSIS, ROUTINE W REFLEX MICROSCOPIC
Bilirubin Urine: NEGATIVE
Glucose, UA: NEGATIVE mg/dL
Ketones, ur: NEGATIVE mg/dL
Nitrite: NEGATIVE
Protein, ur: NEGATIVE mg/dL
Specific Gravity, Urine: >1.03 — ABNORMAL HIGH (ref 1.005–1.030)
pH: 6 (ref 5.0–8.0)

## 2021-07-09 LAB — COMPREHENSIVE METABOLIC PANEL
ALT: 12 U/L (ref 0–44)
AST: 17 U/L (ref 15–41)
Albumin: 3.4 g/dL — ABNORMAL LOW (ref 3.5–5.0)
Alkaline Phosphatase: 49 U/L (ref 38–126)
Anion gap: 5 (ref 5–15)
BUN: 17 mg/dL (ref 6–20)
CO2: 26 mmol/L (ref 22–32)
Calcium: 8.6 mg/dL — ABNORMAL LOW (ref 8.9–10.3)
Chloride: 108 mmol/L (ref 98–111)
Creatinine, Ser: 0.77 mg/dL (ref 0.44–1.00)
GFR, Estimated: >60 mL/min (ref >60)
Glucose, Bld: 94 mg/dL (ref 70–99)
Potassium: 3.8 mmol/L (ref 3.5–5.1)
Sodium: 139 mmol/L (ref 135–145)
Total Bilirubin: 0.4 mg/dL (ref 0.3–1.2)
Total Protein: 6.2 g/dL — ABNORMAL LOW (ref 6.5–8.1)

## 2021-07-09 LAB — URINALYSIS, MICROSCOPIC (REFLEX)

## 2021-07-09 LAB — LIPASE, BLOOD: Lipase: 20 U/L (ref 11–51)

## 2021-07-09 LAB — I-STAT BETA HCG BLOOD, ED (MC, WL, AP ONLY): I-stat hCG, quantitative: <5 m[IU]/mL (ref <5)

## 2021-07-09 MED ORDER — HYDROCODONE-ACETAMINOPHEN 5-325 MG PO TABS
1.0000 | ORAL_TABLET | Freq: Once | ORAL | Status: AC
  Administered 2021-07-09: 1 via ORAL
  Filled 2021-07-09: qty 1

## 2021-07-09 MED ORDER — METRONIDAZOLE 500 MG PO TABS: 500.0000 mg | ORAL_TABLET | Freq: Two times a day (BID) | ORAL | 0 refills | Status: DC

## 2021-07-09 MED ORDER — HYDROCODONE-ACETAMINOPHEN 5-325 MG PO TABS: 1.0000 | ORAL_TABLET | Freq: Four times a day (QID) | ORAL | 0 refills | Status: DC | PRN

## 2021-07-09 NOTE — ED Triage Notes (Signed)
Patient complains of intermittent right-sided abdominal pain that started in December 2022. Patient states she went to an Urgent Care in December for same but received no definitive answer to cause of pain. Patient denies nausea, vomiting, and diarrhea. Patient is alert, oriented, and in no apparent distress at this time.

## 2021-07-09 NOTE — ED Provider Notes (Signed)
[facility] EMERGENCY DEPARTMENT Provider Note   CSN: [id number] Arrival date & time: 07/09/21  1114     History  Chief Complaint  Patient presents with   Abdominal Pain    Alyssa Woodard is a 26 y.o. female.  Patient is a 26 year old female with a history of prior C-section, morbid obesity who is presenting today with complaints of right upper quadrant abdominal pain.  Patient reports that symptoms have now been going on since the end of December.  They are always in the right upper quadrant and then she reports it radiates into her back.  It seems to be worse when laying down at night but she cannot really think of any specific thing that exacerbates that.  She does not feel that it is any worse with eating she has not had any nausea vomiting or bowel changes.  She denies any urinary symptoms and reports she has been having normal menses.  She has not had fever but occasionally feels that it takes her breath away.  She has not had a productive cough or chest pain.  The history is provided by the patient and medical records.  Abdominal Pain Pain location:  RUQ Pain quality: aching, cramping, sharp, shooting and throbbing   Pain radiates to:  Back Pain severity:  Severe Onset quality:  Gradual Duration: 2 months.     Home Medications Prior to Admission medications   Medication Sig Start Date End Date Taking? Authorizing Provider  HYDROcodone-acetaminophen (NORCO/VICODIN) 5-325 MG tablet Take 1 tablet by mouth every 6 (six) hours as needed for severe pain. 07/09/21  Yes Chue Berkovich, Alphonzo Lemmings, MD  metroNIDAZOLE (FLAGYL) 500 MG tablet Take 1 tablet (500 mg total) by mouth 2 (two) times daily. 07/09/21  Yes Leeyah Heather, Alphonzo Lemmings, MD  ondansetron (ZOFRAN-ODT) 4 MG disintegrating tablet Take 1 tablet (4 mg total) by mouth every 8 (eight) hours as needed for nausea or vomiting. 06/18/21   Raspet, Noberto Retort, PA-C      Allergies    Patient has no known allergies.    Review of  Systems   Review of Systems  Gastrointestinal:  Positive for abdominal pain.   Physical Exam Updated Vital Signs BP (!) 134/95 (BP Location: Right Wrist)    Pulse 82    Temp 98.3 F (36.8 C) (Oral)    Resp 18    SpO2 100%  Physical Exam Vitals and nursing note reviewed.  Constitutional:      General: She is not in acute distress.    Appearance: She is well-developed.  HENT:     Head: Normocephalic and atraumatic.  Eyes:     Conjunctiva/sclera: Conjunctivae normal.     Pupils: Pupils are equal, round, and reactive to light.  Cardiovascular:     Rate and Rhythm: Normal rate and regular rhythm.     Heart sounds: No murmur heard. Pulmonary:     Effort: Pulmonary effort is normal. No respiratory distress.     Breath sounds: Normal breath sounds. No wheezing or rales.  Abdominal:     General: There is no distension.     Palpations: Abdomen is soft.     Tenderness: There is abdominal tenderness in the right upper quadrant. There is no right CVA tenderness, left CVA tenderness, guarding or rebound. Positive signs include Murphy's sign.     Hernia: No hernia is present.  Musculoskeletal:        General: No tenderness. Normal range of motion.     Cervical back: Normal  range of motion and neck supple.  Skin:    General: Skin is warm and dry.     Findings: No erythema or rash.  Neurological:     Mental Status: She is alert and oriented to person, place, and time. Mental status is at baseline.  Psychiatric:        Mood and Affect: Mood normal.        Behavior: Behavior normal.    ED Results / Procedures / Treatments   Labs (all labs ordered are listed, but only abnormal results are displayed) Labs Reviewed  COMPREHENSIVE METABOLIC PANEL - Abnormal; Notable for the following components:      Result Value   Calcium 8.6 (*)    Total Protein 6.2 (*)    Albumin 3.4 (*)    All other components within normal limits  CBC - Abnormal; Notable for the following components:   RBC 3.83 (*)     Hemoglobin 10.9 (*)    HCT 35.4 (*)    All other components within normal limits  URINALYSIS, ROUTINE W REFLEX MICROSCOPIC - Abnormal; Notable for the following components:   APPearance HAZY (*)    Specific Gravity, Urine >1.030 (*)    Hgb urine dipstick SMALL (*)    Leukocytes,Ua TRACE (*)    All other components within normal limits  URINALYSIS, MICROSCOPIC (REFLEX) - Abnormal; Notable for the following components:   Bacteria, UA FEW (*)    Trichomonas, UA PRESENT (*)    All other components within normal limits  LIPASE, BLOOD  I-STAT BETA HCG BLOOD, ED ([facility], [facility], [facility] ONLY)    EKG None  Radiology US Abdomen Limited RUQ (LIVER/GB)  Result Date: 07/09/2021 CLINICAL DATA:  Right upper quadrant pain. EXAM: ULTRASOUND ABDOMEN LIMITED RIGHT UPPER QUADRANT COMPARISON:  None. FINDINGS: Gallbladder: Gallbladder sludge is present. Nonmobile shadowing stones are present measuring up to 6 mm. There is no gallbladder wall thickening or pericholecystic fluid. No sonographic Murphy sign noted by sonographer. Common bile duct: Diameter: 3.0 mm. Liver: No focal lesion identified. Increase in parenchymal echogenicity. Portal vein is patent on color Doppler imaging with normal direction of blood flow towards the liver. Other: Technically limited study secondary to body habitus. IMPRESSION: 1. Cholelithiasis and gallbladder sludge. No additional sonographic evidence for acute cholecystitis. 2. Echogenic liver likely related to fatty infiltration. Electronically Signed   By: Darliss CheneyAmy  Guttmann M.D.   On: 07/09/2021 18:07    Procedures Procedures    Medications Ordered in ED Medications  HYDROcodone-acetaminophen (NORCO/VICODIN) 5-325 MG per tablet 1 tablet (1 tablet Oral Given 07/09/21 1606)    ED Course/ Medical Decision Making/ A&P                           Medical Decision Making Amount and/or Complexity of Data Reviewed External Data Reviewed: notes. Labs: ordered. Decision-making details  documented in ED Course. Radiology: ordered and independent interpretation performed. Decision-making details documented in ED Course.  Risk OTC drugs. Prescription drug management.   Patient is a 26 year old female presenting today with 2 months of worsening right upper quadrant pain.  Patient was seen at urgent care per evaluation of external medical records about a month ago and at that time had normal lab work including a CBC, CMP and lipase.  She did not have any imaging at that time.  She reports pain has been worsening but no significant associated symptoms. I pendantly evaluated patient's blood work which shows a CMP without  acute findings with normal creatinine, LFTs and total bilirubin, CBC with persistent mild anemia with a hemoglobin of 10.9 but unchanged from prior studies, hCG is negative and UA without signs of UTI but patient is positive for trichomonas.  She is denying any urinary or vaginal complaints and pain is very high up in the abdomen.  Low suspicion for PID, TOA, UTI, nephrolithiasis, appendicitis or diverticulitis.  Patient has no evidence on lab work consistent with hepatitis.  Concern for cholecystitis versus musculoskeletal etiology.  Low suspicion for respiratory cause given patient's symptoms have been present for 2 months and she is not having shortness of breath, cough or other respiratory complaints.  She is not tachycardic or hypoxic here and low suspicion for PE.  7:40 PM I independently evaluated and interpreted patient's right upper quadrant ultrasound appears to have gallstones.  Radiology reported the patient has gallbladder sludge and multiple gallstones.  No evidence of cholecystitis at this time.  On repeat evaluation patient pain has improved.  She appears comfortable.  Discussed the findings of her scans and answered her questions.  Stressed to her she needed to call general surgery as her gallbladder would need to be removed but appears to be elective at this  time.  She was given return precautions.  She has no social determinants affecting her care today and does not meet criteria for admission.  Her most recent external medical records were evaluated from her urgent care visit.        Final Clinical Impression(s) / ED Diagnoses Final diagnoses:  RUQ abdominal pain  Calculus of gallbladder without cholecystitis without obstruction    Rx / DC Orders ED Discharge Orders          Ordered    HYDROcodone-acetaminophen (NORCO/VICODIN) 5-325 MG tablet  Every 6 hours PRN        07/09/21 1857    metroNIDAZOLE (FLAGYL) 500 MG tablet  2 times daily        07/09/21 1938              Gwyneth Sprout, MD 07/09/21 1941

## 2021-07-09 NOTE — Discharge Instructions (Addendum)
You have gallstones which is probably what is causing your pain in your stomach.  Avoid fatty foods and things that are fried.  Take Tylenol or ibuprofen as needed for the pain but if it becomes severe you can try taking a pain tablet.  If you start having vomiting, high fever return to the emergency room.

## 2021-07-09 NOTE — ED Provider Triage Note (Signed)
Emergency Medicine Provider Triage Evaluation Note  Alyssa Woodard , a 26 y.o. female  was evaluated in triage.  Pt complains of intermittent right-sided abdominal pain since December 2022.  Patient states that she went to urgent care in December for the same symptoms, but received no definitive answer to the cause of her pain. She said she was told it could be gallstones but nothing was done after that point. She has not been taking anything for her symptoms.   Review of Systems  Positive: Abdominal pain, right flank pain Negative: Fevers, chills, nausea, vomiting, diarrhea, urinary symptoms  Physical Exam  BP (!) 152/118    Pulse 85    Temp 98.2 F (36.8 C) (Oral)    Resp 16    SpO2 100%  Gen:   Awake, no distress   Resp:  Normal effort  MSK:   Moves extremities without difficulty  Other:    Medical Decision Making  Medically screening exam initiated at 12:16 PM.  Appropriate orders placed.  Alyssa Woodard was informed that the remainder of the evaluation will be completed by another provider, this initial triage assessment does not replace that evaluation, and the importance of remaining in the ED until their evaluation is complete.     Alyssa Styles T, PA-C 07/09/21 1222

## 2021-07-11 ENCOUNTER — Ambulatory Visit: Payer: Medicaid Other | Admitting: Internal Medicine

## 2021-07-11 ENCOUNTER — Telehealth: Payer: Self-pay | Admitting: Nurse Practitioner

## 2021-07-11 NOTE — Telephone Encounter (Signed)
Patients mother called and stated that patient is in sever pain and can not wait for appointment on 2/13 with [facility]. I told her three were no appointment available between now and then. Seeking advice if there is anything she can do to hold her off until her appointment. Please advise.

## 2021-07-11 NOTE — Telephone Encounter (Signed)
Patient will be new to [facility]. Reviewed her recent urgent care and ER encounter notes. Reiterated the instructions given at the ER visit.  Moved her appointment to the first available appointment opening held for urgent triage.

## 2021-07-17 ENCOUNTER — Encounter: Payer: Self-pay | Admitting: Gastroenterology

## 2021-07-17 ENCOUNTER — Encounter (INDEPENDENT_AMBULATORY_CARE_PROVIDER_SITE_OTHER): Payer: Self-pay

## 2021-07-17 ENCOUNTER — Ambulatory Visit (INDEPENDENT_AMBULATORY_CARE_PROVIDER_SITE_OTHER): Payer: Medicaid Other | Admitting: Gastroenterology

## 2021-07-17 ENCOUNTER — Other Ambulatory Visit (INDEPENDENT_AMBULATORY_CARE_PROVIDER_SITE_OTHER): Payer: Medicaid Other

## 2021-07-17 VITALS — BP 124/74 | HR 76 | Ht 61.0 in | Wt >= 6400 oz

## 2021-07-17 DIAGNOSIS — K760 Fatty (change of) liver, not elsewhere classified: Secondary | ICD-10-CM | POA: Diagnosis not present

## 2021-07-17 DIAGNOSIS — K8020 Calculus of gallbladder without cholecystitis without obstruction: Secondary | ICD-10-CM

## 2021-07-17 DIAGNOSIS — D649 Anemia, unspecified: Secondary | ICD-10-CM

## 2021-07-17 DIAGNOSIS — Z791 Long term (current) use of non-steroidal anti-inflammatories (NSAID): Secondary | ICD-10-CM

## 2021-07-17 DIAGNOSIS — R1011 Right upper quadrant pain: Secondary | ICD-10-CM | POA: Diagnosis not present

## 2021-07-17 DIAGNOSIS — K219 Gastro-esophageal reflux disease without esophagitis: Secondary | ICD-10-CM | POA: Diagnosis not present

## 2021-07-17 LAB — TSH: TSH: 1.68 u[IU]/mL (ref 0.35–5.50)

## 2021-07-17 LAB — IBC + FERRITIN
Ferritin: 17 ng/mL (ref 10.0–291.0)
Iron: 46 ug/dL (ref 42–145)
Saturation Ratios: 9.6 % — ABNORMAL LOW (ref 20.0–50.0)
TIBC: 477.4 ug/dL — ABNORMAL HIGH (ref 250.0–450.0)
Transferrin: 341 mg/dL (ref 212.0–360.0)

## 2021-07-17 LAB — H. PYLORI ANTIBODY, IGG: H Pylori IgG: NEGATIVE

## 2021-07-17 LAB — VITAMIN B12: Vitamin B-12: 312 pg/mL (ref 211–911)

## 2021-07-17 MED ORDER — OMEPRAZOLE 40 MG PO CPDR: 40.0000 mg | DELAYED_RELEASE_CAPSULE | Freq: Two times a day (BID) | ORAL | 0 refills | Status: DC

## 2021-07-17 MED ORDER — CYCLOBENZAPRINE HCL 5 MG PO TABS: 5.0000 mg | ORAL_TABLET | Freq: Three times a day (TID) | ORAL | 1 refills | Status: DC | PRN

## 2021-07-17 NOTE — Progress Notes (Signed)
HPI :  26 year old female with a history of gallstones, morbid obesity with BMI of 82.95, weight of 439 pounds, history of C-section, self-referred here for further evaluation of right upper quadrant pain and gallstones.  She states since December she has had discomfort in her right upper quadrant.  States she rates this as "10 out of 10".  Pain is present all the time and never goes away.  It radiates into her right flank and down through her right lower back.  She states severity can fluctuate, she was seen in the ED for worsening symptoms and was evaluated with lab work and an ultrasound.  Ultrasound showed some gallstones and fatty liver.  Her LFTs have been normal, lipase normal.  CBC showed hemoglobin of 10.9 most recently with an MCV of 92.4.  She has had an intermittent anemia with hemoglobin ranging from nines to 11's over the past 2 years.  She states she was unaware of this.  Again she states her pain is constant, there all the time.  Eating specifically does not make her pain any worse although she does have a decreased appetite from the pain.  She has occasional nausea but does not vomit.  She does have a lot of heartburn and reflux symptoms bother her lately.  No dysphagia.  She was given oxycodone in the ED which she takes as needed which she states helps her pain.  She is not taking anything for reflux.  She is also been using Advil at least daily since December to treat this pain.  She denies any blood in her stools, moving her bowels okay.  She does endorse heavy menstrual cycles at baseline.  She denies any trauma or musculoskeletal strain that she is aware of to her back or right upper quadrant.  She does not smoke tobacco, does not drink alcohol, does not smoke marijuana.  No family history of colon cancer, gastric cancer etc.  She currently does not have a primary care physician, due to established [facility].  During our  encounter today she put her mother on the phone to discuss her symptoms and plan as well with me.   RUQ [place] 07/09/21: Gallbladder:   Gallbladder sludge is present. Nonmobile shadowing stones are present measuring up to 6 mm. There is no gallbladder wall thickening or pericholecystic fluid. No sonographic Murphy sign noted by sonographer.   Common bile duct:   Diameter: 3.0 mm.   Liver:   No focal lesion identified. Increase in parenchymal echogenicity. Portal vein is patent on color Doppler imaging with normal direction of blood flow towards the liver.   Other: Technically limited study secondary to body habitus.   IMPRESSION: 1. Cholelithiasis and gallbladder sludge. No additional sonographic evidence for acute cholecystitis. 2. Echogenic liver likely related to fatty infiltration.      Past Medical History:  Diagnosis Date   Medical history non-contributory      Past Surgical History:  Procedure Laterality Date   CESAREAN SECTION N/A 12/02/2019   Procedure: CESAREAN SECTION;  Surgeon: Catalina Antigua, MD;  Location: [facility] LD [facility]S;  Service: Obstetrics;  Laterality: N/A;   Family History  Problem Relation Age of Onset   Hypertension Mother    Breast cancer Maternal Aunt    Colon cancer Neg Hx    Esophageal cancer Neg Hx    Pancreatic cancer Neg Hx    Stomach cancer Neg Hx    Liver disease Neg Hx  Social History   Tobacco Use   Smoking status: Former    Types: Cigarettes   Smokeless tobacco: Never   Tobacco comments:    none since 04/22/2019  Vaping Use   Vaping Use: Never used  Substance Use Topics   Alcohol use: Not Currently    Comment: socially   Drug use: Not Currently   No current outpatient medications on file.   No current facility-administered medications for this visit.   No Known Allergies   Review of Systems: All systems reviewed and negative except where noted in HPI.    US Abdomen Limited RUQ (LIVER/GB)  Result Date:  07/09/2021 CLINICAL DATA:  Right upper quadrant pain. EXAM: ULTRASOUND ABDOMEN LIMITED RIGHT UPPER QUADRANT COMPARISON:  None. FINDINGS: Gallbladder: Gallbladder sludge is present. Nonmobile shadowing stones are present measuring up to 6 mm. There is no gallbladder wall thickening or pericholecystic fluid. No sonographic Murphy sign noted by sonographer. Common bile duct: Diameter: 3.0 mm. Liver: No focal lesion identified. Increase in parenchymal echogenicity. Portal vein is patent on color Doppler imaging with normal direction of blood flow towards the liver. Other: Technically limited study secondary to body habitus. IMPRESSION: 1. Cholelithiasis and gallbladder sludge. No additional sonographic evidence for acute cholecystitis. 2. Echogenic liver likely related to fatty infiltration. Electronically Signed   By: Darliss Cheney M.D.   On: 07/09/2021 18:07    Physical Exam: BP 124/74    Pulse 76    Ht 5\' 1"  (1.549 m)    Wt (!) 439 lb (199.1 kg)    BMI 82.95 kg/m  Constitutional: Pleasant,well-developed, female in no acute distress. Cardiovascular: Normal rate, regular rhythm.  Pulmonary/chest: Effort normal and breath sounds normal. No wheezing, rales or rhonchi. Abdominal: Soft, nondistended, tenderness to RUQ and R flank and into R lower to mid back.  There are no masses palpable.  Extremities: no edema Lymphadenopathy: No cervical adenopathy noted. Neurological: Alert and oriented to person place and time. Skin: Skin is warm and dry. No rashes noted. Psychiatric: Normal mood and affect. Behavior is normal.   ASSESSMENT AND PLAN:  26 year old female here for new patient assessment the following:  Right upper quadrant pain Gallstones GERD Fatty liver NSAID use Anemia  Her main concern is right upper quadrant pain.  Given description of it, present all the time, radiates to her lower to mid back, no worse with eating etc., is not typical for biliary colic related to gallstones.  We  discussed gallstones in general, risks for biliary colic and pancreatitis etc., however I am not convinced gallstones are causing all of her symptoms at this time.  We discussed that if we are certain that gallstones are causing symptoms then treatment would be cholecystectomy.  Given her body mass index she is high risk for surgery etc.  We discussed ddx otherwise. She is taking NSAIDs routinely, at risk for PUD, recommend she stop all NSAIDs and use only Tylenol as needed.  I would like to place her on omeprazole 40 mg twice daily for a few weeks to treat for any component of gastritis or PUD that may be occurring.  We will check an H. pylori IgG serology and treat if positive.  She has a mild anemia, very well could be due to her heavy menses, will check iron studies, B12, folate today as well.  Otherwise, her symptoms are most consistent with musculoskeletal strain and we discussed this possibility.  I we will give her a trial of Flexeril to see if muscle  relaxers will help ease this off as well.  We will see how she does over the next week or 2 with this regimen.  If her symptoms fail to improve can consider a HIDA scan or having her see a surgeon but again her symptoms are not typical for biliary colic.  She is high risk for elective endoscopy or surgery given her morbid obesity.  She would benefit from weight loss.  She does have fatty liver on ultrasound this will need to be monitored over time, LFTs normal at this time.  She needs to tablets with primary care physician as well, she is doing so I will see them later this week.  Plan: - stop all NSAIDs - lab for H pylori IgG, TIBC / ferritin, B12, TSH - trial of Omeprazole 40mg  BID for a few weeks - trial of Flexeril 5 to 10mg  TID to treat for musculoskeletal pain - needs to establish with a PCP - weight loss recommended in general. She is higher risk for elective surgery or endoscopic evaluation. I am not convinced she has biliary colic from  gallstones based on symptoms she relays today, but possible. If symptoms persist consider HIDA scan  Patient and her mother agree with plan as outlined.  , MD [facility]

## 2021-07-17 NOTE — Patient Instructions (Addendum)
If you are age 26 or older, your body mass index should be between 23-30. Your Body mass index is 82.95 kg/m. If this is out of the aforementioned range listed, please consider follow up with your Primary Care Provider.  If you are age 72 or younger, your body mass index should be between 19-25. Your Body mass index is 82.95 kg/m. If this is out of the aformentioned range listed, please consider follow up with your Primary Care Provider.   ________________________________________________________  The [facility] providers would like to encourage you to use [facility] to communicate with providers for non-urgent requests or questions.  Due to long hold times on the telephone, sending your provider a message by [facility] may be a faster and more efficient way to get a response.  Please allow 48 business hours for a response.  Please remember that this is for non-urgent requests.  _______________________________________________________  We have sent the following medications to your pharmacy for you to pick up at your convenience: Flexeril 5mg : Take 1 to 2 tablets 3 times a day as needed Omeprazole 40mg : Take twice a day for 30 days  Stop all NSAIDs. Take Tylenol as needed  Please go to the lab in the basement of our building to have lab work done as you leave today. Hit "B" for basement when you get on the elevator.  When the doors open the lab is on your left.  We will call you with the results. Thank you.  Thank you for entrusting me with your care and for choosing [facility], [facility]. 

## 2021-07-21 ENCOUNTER — Encounter (HOSPITAL_COMMUNITY): Payer: Self-pay

## 2021-07-21 ENCOUNTER — Other Ambulatory Visit: Payer: Self-pay

## 2021-07-21 ENCOUNTER — Emergency Department (HOSPITAL_COMMUNITY)
Admission: EM | Admit: 2021-07-21 | Discharge: 2021-07-22 | Disposition: A | Discharge status: Home / Self Care | Payer: Medicaid Other | Attending: Emergency Medicine | Admitting: Emergency Medicine

## 2021-07-21 ENCOUNTER — Emergency Department (HOSPITAL_COMMUNITY): Payer: Medicaid Other

## 2021-07-21 DIAGNOSIS — N9489 Other specified conditions associated with female genital organs and menstrual cycle: Secondary | ICD-10-CM | POA: Diagnosis not present

## 2021-07-21 DIAGNOSIS — K8020 Calculus of gallbladder without cholecystitis without obstruction: Secondary | ICD-10-CM | POA: Insufficient documentation

## 2021-07-21 DIAGNOSIS — R109 Unspecified abdominal pain: Secondary | ICD-10-CM

## 2021-07-21 DIAGNOSIS — R1011 Right upper quadrant pain: Secondary | ICD-10-CM | POA: Diagnosis present

## 2021-07-21 MED ORDER — SODIUM CHLORIDE 0.9 % IV BOLUS
1000.0000 mL | Freq: Once | INTRAVENOUS | Status: AC
  Administered 2021-07-21: 1000 mL via INTRAVENOUS

## 2021-07-21 MED ORDER — HYDROMORPHONE HCL 1 MG/ML IJ SOLN
1.0000 mg | Freq: Once | INTRAMUSCULAR | Status: AC
  Administered 2021-07-22: 1 mg via INTRAVENOUS
  Filled 2021-07-21: qty 1

## 2021-07-21 MED ORDER — MORPHINE SULFATE (PF) 4 MG/ML IV SOLN
4.0000 mg | Freq: Once | INTRAVENOUS | Status: AC
  Administered 2021-07-21: 4 mg via INTRAVENOUS
  Filled 2021-07-21: qty 1

## 2021-07-21 MED ORDER — ONDANSETRON HCL 4 MG/2ML IJ SOLN
4.0000 mg | Freq: Once | INTRAMUSCULAR | Status: AC
  Administered 2021-07-21: 4 mg via INTRAVENOUS
  Filled 2021-07-21: qty 2

## 2021-07-21 MED ORDER — PROCHLORPERAZINE EDISYLATE 10 MG/2ML IJ SOLN
10.0000 mg | Freq: Once | INTRAMUSCULAR | Status: AC
  Administered 2021-07-22: 10 mg via INTRAVENOUS
  Filled 2021-07-21: qty 2

## 2021-07-21 NOTE — ED Triage Notes (Signed)
Pt BIB EMS with right sided abdominal pain radiating to her back for the past month. Pt reports having a hx of gallstones and has previously been on a gallstone diet. Today, however, the pt broke her diet and ate some spicy wings and steak, which is when her pain got severely worse.   Pt. Denies CP, N/V/D.   VSS w/ EMS

## 2021-07-21 NOTE — ED Provider Notes (Signed)
[facility] EMERGENCY DEPARTMENT Provider Note   CSN: [id number] Arrival date & time: 07/21/21  2151     History  Chief Complaint  Patient presents with   Abdominal Pain    Alyssa Woodard is a 26 y.o. female with a hx of prior c-section who presents to the ED with complaints of abdominal pain that acutely worsened tonight.  Patient reports he has been having intermittent pain for the past 1 to 2 months, seen by her PCP who thought that this could be potentially gallbladder related.  Tonight she had significant acute worsening of her pain, this was located in the right upper quadrant, radiates to her back, it is constant, severe.  She has associated nausea and vomiting.  She denies fever, diarrhea, dysuria, hematuria, chest pain, or dyspnea.  Tonight prior to onset of pain she ate spicy wings and steak and her pain got severely worse.  HPI     Home Medications Prior to Admission medications   Medication Sig Start Date End Date Taking? Authorizing Provider  cyclobenzaprine (FLEXERIL) 5 MG tablet Take 1-2 tablets (5-10 mg total) by mouth 3 (three) times daily as needed for muscle spasms. 07/17/21   Armbruster, Carlota Raspberry, MD  omeprazole (PRILOSEC) 40 MG capsule Take 1 capsule (40 mg total) by mouth 2 (two) times daily. 07/17/21 08/16/21  Armbruster, Carlota Raspberry, MD      Allergies    Patient has no known allergies.    Review of Systems   Review of Systems  Constitutional:  Negative for chills and fever.  Respiratory:  Negative for shortness of breath.   Cardiovascular:  Negative for chest pain.  Gastrointestinal:  Positive for abdominal pain, nausea and vomiting. Negative for constipation and diarrhea.  Genitourinary:  Negative for dysuria.  All other systems reviewed and are negative.  Physical Exam Updated Vital Signs BP (!) 140/105    Pulse 90    Temp (!) 97.5 F (36.4 C)    Resp 20    Ht 5\' 1"  (1.549 m)    Wt (!) 199.1 kg    SpO2 100%    BMI 82.95 kg/m  Physical  Exam Vitals and nursing note reviewed.  Constitutional:      General: She is in acute distress (appears uncomfortable).     Appearance: She is well-developed. She is obese. She is not toxic-appearing.  HENT:     Head: Normocephalic and atraumatic.  Eyes:     General:        Right eye: No discharge.        Left eye: No discharge.     Conjunctiva/sclera: Conjunctivae normal.  Cardiovascular:     Rate and Rhythm: Normal rate and regular rhythm.  Pulmonary:     Effort: Pulmonary effort is normal. No respiratory distress.     Breath sounds: Normal breath sounds. No wheezing, rhonchi or rales.  Abdominal:     General: There is no distension.     Palpations: Abdomen is soft.     Tenderness: There is abdominal tenderness in the right upper quadrant. There is guarding (voluntary in RUQ). Negative signs include McBurney's sign.  Musculoskeletal:     Cervical back: Neck supple.  Skin:    General: Skin is warm and dry.     Findings: No rash.  Neurological:     Mental Status: She is alert.     Comments: Clear speech.   Psychiatric:        Behavior: Behavior normal.  ED Results / Procedures / Treatments   Labs (all labs ordered are listed, but only abnormal results are displayed) Labs Reviewed  COMPREHENSIVE METABOLIC PANEL  CBC WITH DIFFERENTIAL/PLATELET  LIPASE, BLOOD  URINALYSIS, ROUTINE W REFLEX MICROSCOPIC  I-STAT BETA HCG BLOOD, ED ([facility], [facility], [facility] ONLY)    EKG None  Radiology US Abdomen Limited RUQ (LIVER/GB)  Result Date: 07/21/2021 CLINICAL DATA:  Abdominal pain EXAM: ULTRASOUND ABDOMEN LIMITED RIGHT UPPER QUADRANT COMPARISON:  07/09/2021 FINDINGS: Gallbladder: Layering small gallstones measuring up to 7 mm. No gallbladder wall thickening or pericholecystic fluid. Positive sonographic Murphy's sign. Common bile duct: Diameter: 6 mm Liver: Hyperechoic hepatic parenchyma, suggesting hepatic steatosis. No focal hepatic lesion is seen. Portal vein is patent on color Doppler  imaging with normal direction of blood flow towards the liver. Other: None. IMPRESSION: Cholelithiasis, without associated sonographic findings to suggest acute cholecystitis. Suspected hepatic steatosis. Electronically Signed   By: Julian Hy M.D.   On: 07/21/2021 23:51    Procedures Procedures    Medications Ordered in ED Medications  HYDROmorphone (DILAUDID) injection 1 mg (has no administration in time range)  prochlorperazine (COMPAZINE) injection 10 mg (has no administration in time range)  sodium chloride 0.9 % bolus 1,000 mL (1,000 mLs Intravenous New Bag/Given 07/21/21 2309)  ondansetron (ZOFRAN) injection 4 mg (4 mg Intravenous Given 07/21/21 2304)  morphine (PF) 4 MG/ML injection 4 mg (4 mg Intravenous Given 07/21/21 2305)    ED Course/ Medical Decision Making/ A&P                           Medical Decision Making Amount and/or Complexity of Data Reviewed Labs: ordered. Radiology: ordered.  Risk Prescription drug management.   Patient presents to the ED with complaints of abdominal pain, this involves an extensive number of treatment options, and is a complaint that carries with it a high risk of complications and morbidity. Nontoxic, vitals with elevated blood pressure.  I exam patient is tearful, she has right upper quadrant abdominal tenderness to palpation and has some voluntary guarding  Ddx including but not limited to cholecystitis, cholelithiasis, choledocholithiasis, cholangitis, GERD, PUD, pancreatitis, appendicitis, ectopic pregnancy, perf obstruction.   Additional history obtained:  Additional history obtained from chart review/nursing note review  Lab Tests:  I Ordered, reviewed, and interpreted labs, pertinent results include:  CBC: Mildly cytosis CMP: Mild ALT elevation  Imaging Studies ordered:  I ordered imaging studies which included RUQ [place],  I independently reviewed & interpreted imaging & am in agreement with radiology impression which shows:   Cholelithiasis, without associated sonographic findings to suggest acute cholecystitis. Suspected hepatic steatosis.  ED Course:  I ordered medications including morphine for pain, zofran for nausea, and fluids for hydration.   23:55: RE-EVAL: No pain relief, actively vomiting, additional medications ordered  Following Dilaudid and Compazine the patient is feeling much better, however she is extremely sleepy and feels out of it after the medicines, will continue to observe in the emergency department.  On reassessment patient is feeling much better, she is completely pain-free, she is tolerating p.o. without difficulty.  Her repeat abdominal exam is without peritoneal signs.  I have a low suspicion for acute surgical process at this time, ultrasound does not show findings of cholecystitis, she has no right lower quadrant abdominal tenderness to suggest appendicitis, and her pregnancy test is negative therefore doubt ectopic.  Suspect sxs related to  symptomatic cholelithiasis given she is completely pain controlled and  tolerating p.o. and would like to go home feel she is reasonable for discharge.  We discussed diet recommendations and close general surgery follow-up with very strict return precautions. I discussed results, treatment plan, need for follow-up, and return precautions with the patient. Provided opportunity for questions, patient confirmed understanding and is in agreement with plan.   Portions of this note were generated with [employer]. Dictation errors may occur despite best attempts at proofreading.         Final Clinical Impression(s) / ED Diagnoses Final diagnoses:  Abdominal pain  Calculus of gallbladder without cholecystitis without obstruction    Rx / DC Orders ED Discharge Orders          Ordered    ondansetron (ZOFRAN-ODT) 4 MG disintegrating tablet  Every 8 hours PRN        07/22/21 0618              Amaryllis Dyke,  PA-C 07/22/21 0622    Maudie Flakes, MD 07/22/21 [phone]

## 2021-07-21 NOTE — ED Notes (Signed)
Patient transported to Ultrasound 

## 2021-07-22 LAB — COMPREHENSIVE METABOLIC PANEL
ALT: 47 U/L — ABNORMAL HIGH (ref 0–44)
AST: 23 U/L (ref 15–41)
Albumin: 4 g/dL (ref 3.5–5.0)
Alkaline Phosphatase: 90 U/L (ref 38–126)
Anion gap: 10 (ref 5–15)
BUN: 14 mg/dL (ref 6–20)
CO2: 22 mmol/L (ref 22–32)
Calcium: 9.3 mg/dL (ref 8.9–10.3)
Chloride: 108 mmol/L (ref 98–111)
Creatinine, Ser: 0.86 mg/dL (ref 0.44–1.00)
GFR, Estimated: >60 mL/min (ref >60)
Glucose, Bld: 119 mg/dL — ABNORMAL HIGH (ref 70–99)
Potassium: 3.8 mmol/L (ref 3.5–5.1)
Sodium: 140 mmol/L (ref 135–145)
Total Bilirubin: 1 mg/dL (ref 0.3–1.2)
Total Protein: 7.3 g/dL (ref 6.5–8.1)

## 2021-07-22 LAB — CBC WITH DIFFERENTIAL/PLATELET
Abs Immature Granulocytes: 0.04 10*3/uL (ref 0.00–0.07)
Basophils Absolute: 0.1 10*3/uL (ref 0.0–0.1)
Basophils Relative: 0 %
Eosinophils Absolute: 0.1 10*3/uL (ref 0.0–0.5)
Eosinophils Relative: 1 %
HCT: 38 % (ref 36.0–46.0)
Hemoglobin: 12 g/dL (ref 12.0–15.0)
Immature Granulocytes: 0 %
Lymphocytes Relative: 20 %
Lymphs Abs: 2.5 10*3/uL (ref 0.7–4.0)
MCH: 29.1 pg (ref 26.0–34.0)
MCHC: 31.6 g/dL (ref 30.0–36.0)
MCV: 92 fL (ref 80.0–100.0)
Monocytes Absolute: 0.7 10*3/uL (ref 0.1–1.0)
Monocytes Relative: 5 %
Neutro Abs: 9.5 10*3/uL — ABNORMAL HIGH (ref 1.7–7.7)
Neutrophils Relative %: 74 %
Platelets: 391 10*3/uL (ref 150–400)
RBC: 4.13 MIL/uL (ref 3.87–5.11)
RDW: 15.1 % (ref 11.5–15.5)
WBC: 12.9 10*3/uL — ABNORMAL HIGH (ref 4.0–10.5)
nRBC: 0 % (ref 0.0–0.2)

## 2021-07-22 LAB — I-STAT BETA HCG BLOOD, ED (MC, WL, AP ONLY): I-stat hCG, quantitative: <5 m[IU]/mL (ref <5)

## 2021-07-22 LAB — LIPASE, BLOOD: Lipase: 24 U/L (ref 11–51)

## 2021-07-22 MED ORDER — ONDANSETRON 4 MG PO TBDP: 4.0000 mg | ORAL_TABLET | Freq: Three times a day (TID) | ORAL | 0 refills | Status: DC | PRN

## 2021-07-22 NOTE — ED Notes (Signed)
Pt given water at bedside. 

## 2021-07-22 NOTE — Discharge Instructions (Addendum)
You were seen in the emergency department today for abdominal pain.  We suspect your pain is related to gallstones.  Please follow the attached diet guidelines to avoid flares of your gallstones.  We would like you to schedule an appointment with a general surgeon for follow-up to discuss options of care.  We would also like you to follow-up with your primary care provider.  We are sending you home with prescription for Zofran to take every 8 hours as needed for nausea/vomiting.  We have prescribed you new medication(s) today. Discuss the medications prescribed today with your pharmacist as they can have adverse effects and interactions with your other medicines including over the counter and prescribed medications. Seek medical evaluation if you start to experience new or abnormal symptoms after taking one of these medicines, seek care immediately if you start to experience difficulty breathing, feeling of your throat closing, facial swelling, or rash as these could be indications of a more serious allergic reaction     Return to the emergency department for any new or worsening symptoms including but not limited to new or worsening pain, fever, inability to keep fluids down, or any other concerns.

## 2021-07-22 NOTE — ED Notes (Signed)
Patient verbalizes understanding of d/c instructions. Opportunities for questions and answers were provided. Pt d/c from ED and ambulated to lobby where family is picking pt up.  

## 2021-07-23 ENCOUNTER — Emergency Department (HOSPITAL_COMMUNITY)
Admission: EM | Admit: 2021-07-23 | Discharge: 2021-07-23 | Disposition: A | Discharge status: Home / Self Care | Payer: Medicaid Other | Attending: Emergency Medicine | Admitting: Emergency Medicine

## 2021-07-23 ENCOUNTER — Encounter (HOSPITAL_COMMUNITY): Payer: Self-pay

## 2021-07-23 ENCOUNTER — Other Ambulatory Visit: Payer: Self-pay

## 2021-07-23 ENCOUNTER — Emergency Department (HOSPITAL_COMMUNITY): Payer: Medicaid Other

## 2021-07-23 ENCOUNTER — Ambulatory Visit: Payer: Medicaid Other | Admitting: Nurse Practitioner

## 2021-07-23 DIAGNOSIS — K8020 Calculus of gallbladder without cholecystitis without obstruction: Secondary | ICD-10-CM | POA: Diagnosis not present

## 2021-07-23 DIAGNOSIS — R1011 Right upper quadrant pain: Secondary | ICD-10-CM

## 2021-07-23 LAB — CBC WITH DIFFERENTIAL/PLATELET
Abs Immature Granulocytes: 0.05 10*3/uL (ref 0.00–0.07)
Basophils Absolute: 0 10*3/uL (ref 0.0–0.1)
Basophils Relative: 0 %
Eosinophils Absolute: 0.2 10*3/uL (ref 0.0–0.5)
Eosinophils Relative: 2 %
HCT: 37.8 % (ref 36.0–46.0)
Hemoglobin: 12 g/dL (ref 12.0–15.0)
Immature Granulocytes: 1 %
Lymphocytes Relative: 24 %
Lymphs Abs: 2.6 10*3/uL (ref 0.7–4.0)
MCH: 29.6 pg (ref 26.0–34.0)
MCHC: 31.7 g/dL (ref 30.0–36.0)
MCV: 93.3 fL (ref 80.0–100.0)
Monocytes Absolute: 0.7 10*3/uL (ref 0.1–1.0)
Monocytes Relative: 6 %
Neutro Abs: 7.3 10*3/uL (ref 1.7–7.7)
Neutrophils Relative %: 67 %
Platelets: 382 10*3/uL (ref 150–400)
RBC: 4.05 MIL/uL (ref 3.87–5.11)
RDW: 14.8 % (ref 11.5–15.5)
WBC: 10.9 10*3/uL — ABNORMAL HIGH (ref 4.0–10.5)
nRBC: 0 % (ref 0.0–0.2)

## 2021-07-23 LAB — LIPASE, BLOOD: Lipase: 20 U/L (ref 11–51)

## 2021-07-23 LAB — COMPREHENSIVE METABOLIC PANEL
ALT: 36 U/L (ref 0–44)
AST: 20 U/L (ref 15–41)
Albumin: 3.8 g/dL (ref 3.5–5.0)
Alkaline Phosphatase: 82 U/L (ref 38–126)
Anion gap: 9 (ref 5–15)
BUN: 10 mg/dL (ref 6–20)
CO2: 24 mmol/L (ref 22–32)
Calcium: 9.3 mg/dL (ref 8.9–10.3)
Chloride: 106 mmol/L (ref 98–111)
Creatinine, Ser: 0.77 mg/dL (ref 0.44–1.00)
GFR, Estimated: >60 mL/min (ref >60)
Glucose, Bld: 95 mg/dL (ref 70–99)
Potassium: 3.9 mmol/L (ref 3.5–5.1)
Sodium: 139 mmol/L (ref 135–145)
Total Bilirubin: 0.6 mg/dL (ref 0.3–1.2)
Total Protein: 7.2 g/dL (ref 6.5–8.1)

## 2021-07-23 MED ORDER — KETOROLAC TROMETHAMINE 30 MG/ML IJ SOLN
30.0000 mg | Freq: Once | INTRAMUSCULAR | Status: AC
  Administered 2021-07-23: 30 mg via INTRAMUSCULAR
  Filled 2021-07-23: qty 1

## 2021-07-23 NOTE — ED Provider Notes (Signed)
[facility] EMERGENCY DEPARTMENT Provider Note   CSN: [id number] Arrival date & time: 07/23/21  1006     History  Chief Complaint  Patient presents with   Abdominal Pain    Alyssa Woodard is a 26 y.o. female who was diagnosed 2 days ago on 07/21/2021 with cholelithiasis and presents today for returning RUQ pain.  Patient states pain started early this morning after she ate and is radiating to her back into the left upper quadrant as well.  She also endorses nausea and some vomiting.  She denies fever, diarrhea, urinary symptoms, chest pain or shortness of breath.   No treatment prior to arrival.  Pain aggravated upon palpation or movement.  Abdominal Pain Associated symptoms: no fever, no shortness of breath and no vomiting       Home Medications Prior to Admission medications   Medication Sig Start Date End Date Taking? Authorizing Provider  cyclobenzaprine (FLEXERIL) 5 MG tablet Take 1-2 tablets (5-10 mg total) by mouth 3 (three) times daily as needed for muscle spasms. 07/17/21   Armbruster, Carlota Raspberry, MD  omeprazole (PRILOSEC) 40 MG capsule Take 1 capsule (40 mg total) by mouth 2 (two) times daily. 07/17/21 08/16/21  Armbruster, Carlota Raspberry, MD  ondansetron (ZOFRAN-ODT) 4 MG disintegrating tablet Take 1 tablet (4 mg total) by mouth every 8 (eight) hours as needed for nausea or vomiting. 07/22/21   Petrucelli, Glynda Jaeger, PA-C      Allergies    Patient has no known allergies.    Review of Systems   Review of Systems  Constitutional:  Negative for fever.  HENT: Negative.    Eyes: Negative.   Respiratory:  Negative for shortness of breath.   Cardiovascular: Negative.   Gastrointestinal:  Positive for abdominal pain. Negative for vomiting.  Endocrine: Negative.   Genitourinary: Negative.   Musculoskeletal: Negative.   Skin:  Negative for rash.  Neurological:  Negative for headaches.  All other systems reviewed and are negative.  Physical Exam Updated Vital  Signs BP 127/80    Pulse 69    Temp 97.9 F (36.6 C)    Resp 18    SpO2 96%  Physical Exam Vitals and nursing note reviewed.  Constitutional:      General: She is not in acute distress.    Appearance: She is not ill-appearing.  HENT:     Head: Atraumatic.  Eyes:     Conjunctiva/sclera: Conjunctivae normal.  Cardiovascular:     Rate and Rhythm: Normal rate and regular rhythm.     Pulses: Normal pulses.     Heart sounds: No murmur heard. Pulmonary:     Effort: Pulmonary effort is normal. No respiratory distress.     Breath sounds: Normal breath sounds.  Abdominal:     General: Abdomen is flat. There is no distension.     Palpations: Abdomen is soft.     Tenderness: There is abdominal tenderness in the right upper quadrant. There is no guarding. Negative signs include Murphy's sign and McBurney's sign.  Musculoskeletal:        General: Normal range of motion.     Cervical back: Normal range of motion.  Skin:    General: Skin is warm and dry.     Capillary Refill: Capillary refill takes less than 2 seconds.  Neurological:     General: No focal deficit present.     Mental Status: She is alert.  Psychiatric:        Mood and Affect:  Mood normal.    ED Results / Procedures / Treatments   Labs (all labs ordered are listed, but only abnormal results are displayed) Labs Reviewed  CBC WITH DIFFERENTIAL/PLATELET - Abnormal; Notable for the following components:      Result Value   WBC 10.9 (*)    All other components within normal limits  LIPASE, BLOOD  COMPREHENSIVE METABOLIC PANEL  URINALYSIS, ROUTINE W REFLEX MICROSCOPIC  PREGNANCY, URINE    EKG None  Radiology US Abdomen Limited RUQ (LIVER/GB)  Result Date: 07/23/2021 CLINICAL DATA:  Right upper quadrant pain EXAM: ULTRASOUND ABDOMEN LIMITED RIGHT UPPER QUADRANT COMPARISON:  Right upper quadrant ultrasound 07/21/2021 FINDINGS: Gallbladder: Small gallstones are again seen measuring up to 7 mm. There is no gallbladder  wall thickening or pericholecystic fluid. Sonographic Murphy's sign was reported negative. Common bile duct: Diameter: 4 mm Liver: Parenchymal echogenicity is mildly increased. There are no focal lesions. Portal vein is patent on color Doppler imaging with normal direction of blood flow towards the liver. Other: None. IMPRESSION: 1. Cholelithiasis without evidence of acute cholecystitis. 2. Probable mild hepatic steatosis. Electronically Signed   By: Valetta Mole M.D.   On: 07/23/2021 12:16   US Abdomen Limited RUQ (LIVER/GB)  Result Date: 07/21/2021 CLINICAL DATA:  Abdominal pain EXAM: ULTRASOUND ABDOMEN LIMITED RIGHT UPPER QUADRANT COMPARISON:  07/09/2021 FINDINGS: Gallbladder: Layering small gallstones measuring up to 7 mm. No gallbladder wall thickening or pericholecystic fluid. Positive sonographic Murphy's sign. Common bile duct: Diameter: 6 mm Liver: Hyperechoic hepatic parenchyma, suggesting hepatic steatosis. No focal hepatic lesion is seen. Portal vein is patent on color Doppler imaging with normal direction of blood flow towards the liver. Other: None. IMPRESSION: Cholelithiasis, without associated sonographic findings to suggest acute cholecystitis. Suspected hepatic steatosis. Electronically Signed   By: Julian Hy M.D.   On: 07/21/2021 23:51    Procedures Procedures   Medications Ordered in ED Medications  ketorolac (TORADOL) 30 MG/ML injection 30 mg (30 mg Intramuscular Given 07/23/21 1246)    ED Course/ Medical Decision Making/ A&P                           Medical Decision Making Risk Prescription drug management.   History:  Per HPI  Initial impression:  This patient presents to the ED for concern of RUQ pain, this involves an extensive number of treatment options, and is a complaint that carries with it a high risk of complications and morbidity.   The differential diagnosis for RUQ is, but not limited to:  Cholelithiasis, cholecystitis, cholangitis,  choledocholithiasis, hepatitis, pancreatitis, RLL pneumonia, pyelonephritis, urinary calculi, abdominal/liver abscess, musculoskeletal pain, herpes zoster.   ED Course: Patient Appears acutely uncomfortable although nontoxic-appearing.  Significant RUQ tenderness without Murphy sign.  CMP, lipase and CBC unremarkable.  Ultrasound of RUQ confirms gallstones but without evidence of Coley cystitis or obstruction.  Pain was resolved with 30 mg Toradol IM.  Concern for pancreatitis is low given normal lipase.  Liver function tests were normal.    Lab Tests and EKG:  I Ordered, reviewed, and interpreted labs and EKG.    Imaging Studies ordered:  I ordered imaging studies including RUQ ultrasound I independently visualized and interpreted imaging and I agree with the radiologist interpretation.    Cardiac Monitoring:  The patient was maintained on a cardiac monitor.  I personally viewed and interpreted the cardiac monitored which showed an underlying rhythm of: NSR   Medicines ordered and prescription drug management:  I ordered medication including: Toradol 30 mg IM Reevaluation of the patient after these medicines showed that the patient resolved I have reviewed the patients home medicines and have made adjustments as needed    Disposition:  After consideration of the diagnostic results, physical exam, history and the patients response to treatment feel that the patent would benefit from discharge with strict return precautions.   Symptomatic cholelithiasis: Patient has no need for admission or surgical intervention at this time.  Pain was well controlled with administration of Toradol IM.  We discussed at home supportive measures that she can try next time she experiences colicky pain prior to coming to the ED.  Also discussed signs and symptoms that warrant return to the ED.  All questions were asked and answered.  Patient was discharged home in good condition.   Final Clinical  Impression(s) / ED Diagnoses Final diagnoses:  RUQ pain  Calculus of gallbladder without cholecystitis without obstruction    Rx / DC Orders ED Discharge Orders     None         Tonye Pearson, PA-C 07/23/21 1344    Fredia Sorrow, MD 07/26/21 2353

## 2021-07-23 NOTE — Discharge Instructions (Signed)
Your ultrasound shows that you still have the gallstones, however they have not obstructed anything I have not caused inflammation or infection of your gallbladder.  The rest of your lab work was normal.  If this happens again, you can take 600 to 800 mg ibuprofen every 8 hours to help with pain.  If this does not improve symptoms, or your pain continues to worsen, please return to the emergency department for reevaluation.

## 2021-07-23 NOTE — ED Triage Notes (Signed)
Patient complains of recurrent gallstone flare-up since yesterday. Denies fatty or fried foods this weekend. Complains of nausea and pain RUQ radiating to back

## 2021-07-23 NOTE — ED Provider Triage Note (Signed)
Emergency Medicine Provider Triage Evaluation Note  Alyssa Woodard , a 26 y.o. female  was evaluated in triage.  Pt complains of quadrant pain worsening since last night.  She reports some nausea with decreased appetite but denies any diarrhea, constipation, or vomiting.  She denies any fever.  She was recently seen here a few days ago for the same pain, mentions has been getting worse.  She has not followed up with general surgery.  Review of Systems  Positive: Abdominal pain, nausea, decreased appetite Negative: Vomiting, diarrhea, constipation, fever  Physical Exam  BP (!) 163/110 (BP Location: Right Arm)    Pulse 79    Temp 97.6 F (36.4 C) (Oral)    Resp 17    SpO2 99%  Gen:   Awake, tearful, uncomfortable appearing Resp:  Normal effort  MSK:   Moves extremities without difficulty  Other:  Patient has diffuse tenderness to right upper quadrant and abdomen as well as CVA tenderness bilaterally.  Medical Decision Making  Medically screening exam initiated at 10:21 AM.  Appropriate orders placed.  Aquanetta N Lun was informed that the remainder of the evaluation will be completed by another provider, this initial triage assessment does not replace that evaluation, and the importance of remaining in the ED until their evaluation is complete.  Right upper quadrant ultrasound and basic labs ordered.   Achille Rich, PA-C 07/23/21 1022

## 2021-08-01 ENCOUNTER — Other Ambulatory Visit: Payer: Self-pay | Admitting: Internal Medicine

## 2021-08-02 LAB — LIPID PANEL
Cholesterol: 125 mg/dL (ref <200)
HDL: 37 mg/dL — ABNORMAL LOW (ref >=50)
LDL Cholesterol (Calc): 75 mg/dL (calc)
Non-HDL Cholesterol (Calc): 88 mg/dL (calc) (ref <130)
Total CHOL/HDL Ratio: 3.4 (calc) (ref <5.0)
Triglycerides: 46 mg/dL (ref <150)

## 2021-08-02 LAB — COMPLETE METABOLIC PANEL WITH GFR
AG Ratio: 1.6 (calc) (ref 1.0–2.5)
ALT: 101 U/L — ABNORMAL HIGH (ref 6–29)
AST: 256 U/L — ABNORMAL HIGH (ref 10–30)
Albumin: 4.2 g/dL (ref 3.6–5.1)
Alkaline phosphatase (APISO): 161 U/L — ABNORMAL HIGH (ref 31–125)
BUN: 12 mg/dL (ref 7–25)
CO2: 26 mmol/L (ref 20–32)
Calcium: 9.2 mg/dL (ref 8.6–10.2)
Chloride: 102 mmol/L (ref 98–110)
Creat: 0.8 mg/dL (ref 0.50–0.96)
Globulin: 2.7 g/dL (calc) (ref 1.9–3.7)
Glucose, Bld: 87 mg/dL (ref 65–99)
Potassium: 4 mmol/L (ref 3.5–5.3)
Sodium: 139 mmol/L (ref 135–146)
Total Bilirubin: 2.1 mg/dL — ABNORMAL HIGH (ref 0.2–1.2)
Total Protein: 6.9 g/dL (ref 6.1–8.1)
eGFR: 105 mL/min/{1.73_m2} (ref >=60)

## 2021-08-02 LAB — CBC
HCT: 36.3 % (ref 35.0–45.0)
Hemoglobin: 12 g/dL (ref 11.7–15.5)
MCH: 29.8 pg (ref 27.0–33.0)
MCHC: 33.1 g/dL (ref 32.0–36.0)
MCV: 90.1 fL (ref 80.0–100.0)
MPV: 9.8 fL (ref 7.5–12.5)
Platelets: 386 10*3/uL (ref 140–400)
RBC: 4.03 10*6/uL (ref 3.80–5.10)
RDW: 14 % (ref 11.0–15.0)
WBC: 7.2 10*3/uL (ref 3.8–10.8)

## 2021-08-02 LAB — VITAMIN D 25 HYDROXY (VIT D DEFICIENCY, FRACTURES): Vit D, 25-Hydroxy: 21 ng/mL — ABNORMAL LOW (ref 30–100)

## 2021-08-02 LAB — TSH: TSH: 1.1 mIU/L

## 2021-08-08 ENCOUNTER — Ambulatory Visit: Payer: Self-pay | Admitting: General Surgery

## 2021-08-08 DIAGNOSIS — K8020 Calculus of gallbladder without cholecystitis without obstruction: Secondary | ICD-10-CM

## 2021-08-16 NOTE — Pre-Procedure Instructions (Signed)
Surgical Instructions ? ? ? Your procedure is scheduled on Tuesday 08/28/21. ? ? Report to [facility] Main Entrance "A" at 08:30 A.M., then check in with the Admitting office. ? Call this number if you have problems the morning of surgery: ? [phone] ? ? If you have any questions prior to your surgery date call [phone]: Open Monday-Friday 8am-4pm ? ? ? Remember: ? Do not eat after midnight the night before your surgery ? ?You may drink clear liquids until 07:30 A.M. the morning of your surgery.   ?Clear liquids allowed are: Water, Non-Citrus Juices (without pulp), Carbonated Beverages, Clear Tea, Black Coffee ONLY (NO MILK, CREAM OR POWDERED CREAMER of any kind), and Gatorade ?  ? Take these medicines the morning of surgery with A SIP OF WATER:  ?omeprazole (PRILOSEC)  ? ? ?cyclobenzaprine (FLEXERIL)- If needed ?ondansetron (ZOFRAN-ODT) - If needed ? ? ?As of today, STOP taking any Aspirin (unless otherwise instructed by your surgeon) Aleve, Naproxen, Ibuprofen, Motrin, Advil, Goody's, BC's, all herbal medications, fish oil, and all vitamins. ? ?         ?Do not wear jewelry or makeup ?Do not wear lotions, powders, perfumes/colognes, or deodorant. ?Do not shave 48 hours prior to surgery.  Men may shave face and neck. ?Do not bring valuables to the hospital. ?Do not wear nail polish, gel polish, artificial nails, or any other type of covering on natural nails (fingers and toes) ?If you have artificial nails or gel coating that need to be removed by a nail salon, please have this removed prior to surgery. Artificial nails or gel coating may interfere with anesthesia's ability to adequately monitor your vital signs. ? ?[facility] is not responsible for any belongings or valuables. .  ? ?Do NOT Smoke (Tobacco/Vaping)  24 hours prior to your procedure ? ?If you use a CPAP at night, you may bring your mask for your overnight stay. ?  ?Contacts, glasses, hearing aids, dentures or partials may not be worn into  surgery, please bring cases for these belongings ?  ?For patients admitted to the hospital, discharge time will be determined by your treatment team. ?  ?Patients discharged the day of surgery will not be allowed to drive home, and someone needs to stay with them for 24 hours. ? ?NO VISITORS WILL BE ALLOWED IN PRE-OP WHERE PATIENTS ARE PREPPED FOR SURGERY.  ONLY 1 SUPPORT PERSON MAY BE PRESENT IN THE WAITING ROOM WHILE YOU ARE IN SURGERY.  IF YOU ARE TO BE ADMITTED, ONCE YOU ARE IN YOUR ROOM YOU WILL BE ALLOWED TWO (2) VISITORS. 1 (ONE) VISITOR MAY STAY OVERNIGHT BUT MUST ARRIVE TO THE ROOM BY 8pm.  Minor children may have two parents present. Special consideration for safety and communication needs will be reviewed on a case by case basis. ? ?Special instructions:   ? ?Oral Hygiene is also important to reduce your risk of infection.  Remember - BRUSH YOUR TEETH THE MORNING OF SURGERY WITH YOUR REGULAR TOOTHPASTE ? ? ?[facility]- Preparing For Surgery ? ?Before surgery, you can play an important role. Because skin is not sterile, your skin needs to be as free of germs as possible. You can reduce the number of germs on your skin by washing with CHG (chlorahexidine gluconate) Soap before surgery.  CHG is an antiseptic cleaner which kills germs and bonds with the skin to continue killing germs even after washing.   ? ? ?Please do not use if you have an allergy to CHG  or antibacterial soaps. If your skin becomes reddened/irritated stop using the CHG.  ?Do not shave (including legs and underarms) for at least 48 hours prior to first CHG shower. It is OK to shave your face. ? ?Please follow these instructions carefully. ?  ? ? Shower the NIGHT BEFORE SURGERY and the MORNING OF SURGERY with CHG Soap.  ? If you chose to wash your hair, wash your hair first as usual with your normal shampoo. After you shampoo, rinse your hair and body thoroughly to remove the shampoo.  Then [employer] and genitals (private parts) with your  normal soap and rinse thoroughly to remove soap. ? ?After that Use CHG Soap as you would any other liquid soap. You can apply CHG directly to the skin and wash gently with a scrungie or a clean washcloth.  ? ?Apply the CHG Soap to your body ONLY FROM THE NECK DOWN.  Do not use on open wounds or open sores. Avoid contact with your eyes, ears, mouth and genitals (private parts). Wash Face and genitals (private parts)  with your normal soap.  ? ?Wash thoroughly, paying special attention to the area where your surgery will be performed. ? ?Thoroughly rinse your body with warm water from the neck down. ? ?DO NOT shower/wash with your normal soap after using and rinsing off the CHG Soap. ? ?Pat yourself dry with a CLEAN TOWEL. ? ?Wear CLEAN PAJAMAS to bed the night before surgery ? ?Place CLEAN SHEETS on your bed the night before your surgery ? ?DO NOT SLEEP WITH PETS. ? ? ?Day of Surgery: ? ?Take a shower with CHG soap. ?Wear Clean/Comfortable clothing the morning of surgery ?Do not apply any deodorants/lotions.   ?Remember to brush your teeth WITH YOUR REGULAR TOOTHPASTE. ? ? ? ?COVID testing ? ?If you are going to stay overnight or be admitted after your procedure/surgery and require a pre-op COVID test, please follow these instructions after your COVID test  ? ?You are not required to quarantine however you are required to wear a well-fitting mask when you are out and around people not in your household.  If your mask becomes wet or soiled, replace with a new one. ? ?Wash your hands often with soap and water for 20 seconds or clean your hands with an alcohol-based hand sanitizer that contains at least 60% alcohol. ? ?Do not share personal items. ? ?Notify your provider: ?if you are in close contact with someone who has COVID  ?or if you develop a fever of 100.4 or greater, sneezing, cough, sore throat, shortness of breath or body aches. ? ?  ?Please read over the following fact sheets that you were given.  ? ?

## 2021-08-17 ENCOUNTER — Encounter (HOSPITAL_COMMUNITY): Payer: Self-pay

## 2021-08-17 ENCOUNTER — Encounter (HOSPITAL_COMMUNITY)
Admission: RE | Admit: 2021-08-17 | Discharge: 2021-08-17 | Disposition: A | Discharge status: Home / Self Care | Payer: Medicaid Other | Source: Ambulatory Visit | Attending: General Surgery | Admitting: General Surgery

## 2021-08-17 ENCOUNTER — Other Ambulatory Visit: Payer: Self-pay

## 2021-08-17 DIAGNOSIS — K8020 Calculus of gallbladder without cholecystitis without obstruction: Secondary | ICD-10-CM | POA: Diagnosis not present

## 2021-08-17 DIAGNOSIS — Z01812 Encounter for preprocedural laboratory examination: Secondary | ICD-10-CM | POA: Insufficient documentation

## 2021-08-17 HISTORY — DX: Anxiety disorder, unspecified: F41.9

## 2021-08-17 HISTORY — DX: Depression, unspecified: F32.A

## 2021-08-17 HISTORY — DX: Gastro-esophageal reflux disease without esophagitis: K21.9

## 2021-08-17 HISTORY — DX: Headache, unspecified: R51.9

## 2021-08-17 LAB — CBC WITH DIFFERENTIAL/PLATELET
Abs Immature Granulocytes: 0.02 10*3/uL (ref 0.00–0.07)
Basophils Absolute: 0.1 10*3/uL (ref 0.0–0.1)
Basophils Relative: 0 %
Eosinophils Absolute: 0.2 10*3/uL (ref 0.0–0.5)
Eosinophils Relative: 2 %
HCT: 37.8 % (ref 36.0–46.0)
Hemoglobin: 12 g/dL (ref 12.0–15.0)
Immature Granulocytes: 0 %
Lymphocytes Relative: 35 %
Lymphs Abs: 4 10*3/uL (ref 0.7–4.0)
MCH: 29.8 pg (ref 26.0–34.0)
MCHC: 31.7 g/dL (ref 30.0–36.0)
MCV: 93.8 fL (ref 80.0–100.0)
Monocytes Absolute: 0.8 10*3/uL (ref 0.1–1.0)
Monocytes Relative: 8 %
Neutro Abs: 6.2 10*3/uL (ref 1.7–7.7)
Neutrophils Relative %: 55 %
Platelets: 365 10*3/uL (ref 150–400)
RBC: 4.03 MIL/uL (ref 3.87–5.11)
RDW: 14.2 % (ref 11.5–15.5)
WBC: 11.2 10*3/uL — ABNORMAL HIGH (ref 4.0–10.5)
nRBC: 0 % (ref 0.0–0.2)

## 2021-08-17 LAB — COMPREHENSIVE METABOLIC PANEL
ALT: 15 U/L (ref 0–44)
AST: 17 U/L (ref 15–41)
Albumin: 3.7 g/dL (ref 3.5–5.0)
Alkaline Phosphatase: 73 U/L (ref 38–126)
Anion gap: 11 (ref 5–15)
BUN: 12 mg/dL (ref 6–20)
CO2: 24 mmol/L (ref 22–32)
Calcium: 9.4 mg/dL (ref 8.9–10.3)
Chloride: 105 mmol/L (ref 98–111)
Creatinine, Ser: 0.86 mg/dL (ref 0.44–1.00)
GFR, Estimated: >60 mL/min (ref >60)
Glucose, Bld: 91 mg/dL (ref 70–99)
Potassium: 3.9 mmol/L (ref 3.5–5.1)
Sodium: 140 mmol/L (ref 135–145)
Total Bilirubin: 0.6 mg/dL (ref 0.3–1.2)
Total Protein: 6.9 g/dL (ref 6.5–8.1)

## 2021-08-17 NOTE — Pre-Procedure Instructions (Signed)
Surgical Instructions ? ? ? Your procedure is scheduled on Tuesday 08/28/21. ? ? Report to [facility] Main Entrance "A" at 08:30 A.M., then check in with the Admitting office. ? Call this number if you have problems the morning of surgery: ? [phone] ? ? If you have any questions prior to your surgery date call [phone]: Open Monday-Friday 8am-4pm ? ? ? Remember: ? Do not eat after midnight the night before your surgery ? ?You may drink clear liquids until 07:30 A.M. the morning of your surgery.   ?Clear liquids allowed are: Water, Non-Citrus Juices (without pulp), Carbonated Beverages, Clear Tea, Black Coffee ONLY (NO MILK, CREAM OR POWDERED CREAMER of any kind), and Gatorade ?  ? Take these medicines the morning of surgery with A SIP OF WATER:  ?omeprazole (PRILOSEC)  ?sertraline (ZOLOFT) ? ?cyclobenzaprine (FLEXERIL)- If needed ?ondansetron (ZOFRAN-ODT) - If needed ? ? ?As of today, STOP taking any Aspirin (unless otherwise instructed by your surgeon) Aleve, Naproxen, Ibuprofen, Motrin, Advil, Goody's, BC's, all herbal medications, fish oil, and all vitamins. ? ?         ?Do not wear jewelry or makeup ?Do not wear lotions, powders, perfumes/colognes, or deodorant. ?Do not shave 48 hours prior to surgery.  Men may shave face and neck. ?Do not bring valuables to the hospital. ?Do not wear nail polish, gel polish, artificial nails, or any other type of covering on natural nails (fingers and toes) ?If you have artificial nails or gel coating that need to be removed by a nail salon, please have this removed prior to surgery. Artificial nails or gel coating may interfere with anesthesia's ability to adequately monitor your vital signs. ? ?[facility] is not responsible for any belongings or valuables. .  ? ?Do NOT Smoke (Tobacco/Vaping)  24 hours prior to your procedure ? ?If you use a CPAP at night, you may bring your mask for your overnight stay. ?  ?Contacts, glasses, hearing aids, dentures or partials may  not be worn into surgery, please bring cases for these belongings ?  ?For patients admitted to the hospital, discharge time will be determined by your treatment team. ?  ?Patients discharged the day of surgery will not be allowed to drive home, and someone needs to stay with them for 24 hours. ? ?NO VISITORS WILL BE ALLOWED IN PRE-OP WHERE PATIENTS ARE PREPPED FOR SURGERY.  ONLY 1 SUPPORT PERSON MAY BE PRESENT IN THE WAITING ROOM WHILE YOU ARE IN SURGERY.  IF YOU ARE TO BE ADMITTED, ONCE YOU ARE IN YOUR ROOM YOU WILL BE ALLOWED TWO (2) VISITORS. 1 (ONE) VISITOR MAY STAY OVERNIGHT BUT MUST ARRIVE TO THE ROOM BY 8pm.  Minor children may have two parents present. Special consideration for safety and communication needs will be reviewed on a case by case basis. ? ?Special instructions:   ? ?Oral Hygiene is also important to reduce your risk of infection.  Remember - BRUSH YOUR TEETH THE MORNING OF SURGERY WITH YOUR REGULAR TOOTHPASTE ? ? ?[facility]- Preparing For Surgery ? ?Before surgery, you can play an important role. Because skin is not sterile, your skin needs to be as free of germs as possible. You can reduce the number of germs on your skin by washing with CHG (chlorahexidine gluconate) Soap before surgery.  CHG is an antiseptic cleaner which kills germs and bonds with the skin to continue killing germs even after washing.   ? ? ?Please do not use if you have an allergy to  CHG or antibacterial soaps. If your skin becomes reddened/irritated stop using the CHG.  ?Do not shave (including legs and underarms) for at least 48 hours prior to first CHG shower. It is OK to shave your face. ? ?Please follow these instructions carefully. ?  ? ? Shower the NIGHT BEFORE SURGERY and the MORNING OF SURGERY with CHG Soap.  ? If you chose to wash your hair, wash your hair first as usual with your normal shampoo. After you shampoo, rinse your hair and body thoroughly to remove the shampoo.  Then [employer] and genitals (private  parts) with your normal soap and rinse thoroughly to remove soap. ? ?After that Use CHG Soap as you would any other liquid soap. You can apply CHG directly to the skin and wash gently with a scrungie or a clean washcloth.  ? ?Apply the CHG Soap to your body ONLY FROM THE NECK DOWN.  Do not use on open wounds or open sores. Avoid contact with your eyes, ears, mouth and genitals (private parts). Wash Face and genitals (private parts)  with your normal soap.  ? ?Wash thoroughly, paying special attention to the area where your surgery will be performed. ? ?Thoroughly rinse your body with warm water from the neck down. ? ?DO NOT shower/wash with your normal soap after using and rinsing off the CHG Soap. ? ?Pat yourself dry with a CLEAN TOWEL. ? ?Wear CLEAN PAJAMAS to bed the night before surgery ? ?Place CLEAN SHEETS on your bed the night before your surgery ? ?DO NOT SLEEP WITH PETS. ? ? ?Day of Surgery: ? ?Take a shower with CHG soap. ?Wear Clean/Comfortable clothing the morning of surgery ?Do not apply any deodorants/lotions.   ?Remember to brush your teeth WITH YOUR REGULAR TOOTHPASTE. ? ? ? ?COVID testing ? ?If you are going to stay overnight or be admitted after your procedure/surgery and require a pre-op COVID test, please follow these instructions after your COVID test  ? ?You are not required to quarantine however you are required to wear a well-fitting mask when you are out and around people not in your household.  If your mask becomes wet or soiled, replace with a new one. ? ?Wash your hands often with soap and water for 20 seconds or clean your hands with an alcohol-based hand sanitizer that contains at least 60% alcohol. ? ?Do not share personal items. ? ?Notify your provider: ?if you are in close contact with someone who has COVID  ?or if you develop a fever of 100.4 or greater, sneezing, cough, sore throat, shortness of breath or body aches. ? ?  ?Please read over the following fact sheets that you were  given.  ? ?

## 2021-08-17 NOTE — Progress Notes (Signed)
PCP - [facility] ?Cardiologist - none ? ?PPM/ICD - N/A ? ?Chest x-ray - N/A ?EKG - N/A ?Stress Test - N/A ?ECHO - N/A ?Cardiac Cath - N/A ? ?Sleep Study - pt denies sleep study- stop bang score 3 ? ?ERAS Protcol - no drink ordered ? ?COVID TEST- N/A ambulatory surgery ? ? ?Anesthesia review: no ? ?Patient denies shortness of breath, fever, cough and chest pain at PAT appointment ? ? ?All instructions explained to the patient, with a verbal understanding of the material. Patient agrees to go over the instructions while at home for a better understanding. Patient also instructed to self quarantine after being tested for COVID-19. The opportunity to ask questions was provided. ? ? ?

## 2021-08-27 MED ORDER — CEFAZOLIN IN SODIUM CHLORIDE 3-0.9 GM/100ML-% IV SOLN
3.0000 g | INTRAVENOUS | Status: DC
  Filled 2021-08-27 (×2): qty 100

## 2021-08-28 ENCOUNTER — Observation Stay (HOSPITAL_COMMUNITY)
Admission: RE | Admit: 2021-08-28 | Discharge: 2021-08-29 | Disposition: A | Discharge status: Home / Self Care | Payer: Medicaid Other | Source: Ambulatory Visit | Attending: General Surgery | Admitting: General Surgery

## 2021-08-28 ENCOUNTER — Other Ambulatory Visit: Payer: Self-pay

## 2021-08-28 ENCOUNTER — Encounter (HOSPITAL_COMMUNITY): Payer: Self-pay | Admitting: General Surgery

## 2021-08-28 ENCOUNTER — Ambulatory Visit (HOSPITAL_COMMUNITY): Payer: Medicaid Other | Admitting: Certified Registered Nurse Anesthetist

## 2021-08-28 ENCOUNTER — Ambulatory Visit (HOSPITAL_COMMUNITY): Payer: Medicaid Other

## 2021-08-28 ENCOUNTER — Encounter (HOSPITAL_COMMUNITY)
Admission: RE | Disposition: A | Discharge status: Home / Self Care | Payer: Self-pay | Source: Ambulatory Visit | Attending: General Surgery

## 2021-08-28 ENCOUNTER — Ambulatory Visit (HOSPITAL_BASED_OUTPATIENT_CLINIC_OR_DEPARTMENT_OTHER): Payer: Medicaid Other | Admitting: Certified Registered Nurse Anesthetist

## 2021-08-28 DIAGNOSIS — K8020 Calculus of gallbladder without cholecystitis without obstruction: Secondary | ICD-10-CM

## 2021-08-28 DIAGNOSIS — Z87891 Personal history of nicotine dependence: Secondary | ICD-10-CM | POA: Insufficient documentation

## 2021-08-28 DIAGNOSIS — Z9049 Acquired absence of other specified parts of digestive tract: Secondary | ICD-10-CM

## 2021-08-28 DIAGNOSIS — K219 Gastro-esophageal reflux disease without esophagitis: Secondary | ICD-10-CM | POA: Insufficient documentation

## 2021-08-28 DIAGNOSIS — K8010 Calculus of gallbladder with chronic cholecystitis without obstruction: Secondary | ICD-10-CM | POA: Diagnosis not present

## 2021-08-28 HISTORY — PX: CHOLECYSTECTOMY: SHX55

## 2021-08-28 LAB — POCT PREGNANCY, URINE: Preg Test, Ur: NEGATIVE

## 2021-08-28 SURGERY — LAPAROSCOPIC CHOLECYSTECTOMY
Anesthesia: General | Site: Abdomen

## 2021-08-28 MED ORDER — CELECOXIB 200 MG PO CAPS: 400.0000 mg | ORAL_CAPSULE | ORAL | Status: AC

## 2021-08-28 MED ORDER — ONDANSETRON 4 MG PO TBDP: 4.0000 mg | ORAL_TABLET | Freq: Four times a day (QID) | ORAL | Status: DC | PRN

## 2021-08-28 MED ORDER — OXYCODONE HCL 5 MG PO TABS: 5.0000 mg | ORAL_TABLET | Freq: Once | ORAL | Status: DC | PRN

## 2021-08-28 MED ORDER — CELECOXIB 200 MG PO CAPS
ORAL_CAPSULE | ORAL | Status: AC
  Administered 2021-08-28: 400 mg via ORAL
  Filled 2021-08-28: qty 2

## 2021-08-28 MED ORDER — OXYCODONE HCL 5 MG/5ML PO SOLN: 5.0000 mg | Freq: Once | ORAL | Status: DC | PRN

## 2021-08-28 MED ORDER — ACETAMINOPHEN 500 MG PO TABS
ORAL_TABLET | ORAL | Status: AC
  Administered 2021-08-28: 1000 mg via ORAL
  Filled 2021-08-28: qty 2

## 2021-08-28 MED ORDER — ONDANSETRON HCL 4 MG/2ML IJ SOLN
INTRAMUSCULAR | Status: DC | PRN
  Administered 2021-08-28: 4 mg via INTRAVENOUS

## 2021-08-28 MED ORDER — BUPIVACAINE-EPINEPHRINE (PF) 0.25% -1:200000 IJ SOLN
INTRAMUSCULAR | Status: AC
  Filled 2021-08-28: qty 30

## 2021-08-28 MED ORDER — DEXAMETHASONE SODIUM PHOSPHATE 10 MG/ML IJ SOLN
INTRAMUSCULAR | Status: DC | PRN
  Administered 2021-08-28: 10 mg via INTRAVENOUS

## 2021-08-28 MED ORDER — DEXAMETHASONE SODIUM PHOSPHATE 10 MG/ML IJ SOLN
INTRAMUSCULAR | Status: AC
  Filled 2021-08-28: qty 1

## 2021-08-28 MED ORDER — TRAMADOL HCL 50 MG PO TABS: 50.0000 mg | ORAL_TABLET | Freq: Four times a day (QID) | ORAL | Status: DC | PRN

## 2021-08-28 MED ORDER — CEFAZOLIN SODIUM 10 G IJ SOLR
INTRAMUSCULAR | Status: DC | PRN
  Administered 2021-08-28: 3 g via INTRAVENOUS

## 2021-08-28 MED ORDER — MORPHINE SULFATE (PF) 4 MG/ML IV SOLN: 4.0000 mg | INTRAVENOUS | Status: DC | PRN

## 2021-08-28 MED ORDER — GABAPENTIN 300 MG PO CAPS: 300.0000 mg | ORAL_CAPSULE | ORAL | Status: AC

## 2021-08-28 MED ORDER — KETOROLAC TROMETHAMINE 30 MG/ML IJ SOLN
INTRAMUSCULAR | Status: AC
  Filled 2021-08-28: qty 1

## 2021-08-28 MED ORDER — MIDAZOLAM HCL 2 MG/2ML IJ SOLN
INTRAMUSCULAR | Status: AC
  Filled 2021-08-28: qty 2

## 2021-08-28 MED ORDER — CHLORHEXIDINE GLUCONATE CLOTH 2 % EX PADS: 6.0000 | MEDICATED_PAD | Freq: Once | CUTANEOUS | Status: DC

## 2021-08-28 MED ORDER — ENOXAPARIN SODIUM 80 MG/0.8ML IJ SOSY: 80.0000 mg | PREFILLED_SYRINGE | INTRAMUSCULAR | Status: DC

## 2021-08-28 MED ORDER — ROCURONIUM BROMIDE 10 MG/ML (PF) SYRINGE
PREFILLED_SYRINGE | INTRAVENOUS | Status: DC | PRN
  Administered 2021-08-28: 60 mg via INTRAVENOUS
  Administered 2021-08-28: 20 mg via INTRAVENOUS

## 2021-08-28 MED ORDER — LIDOCAINE 2% (20 MG/ML) 5 ML SYRINGE
INTRAMUSCULAR | Status: AC
  Filled 2021-08-28: qty 5

## 2021-08-28 MED ORDER — FENTANYL CITRATE (PF) 100 MCG/2ML IJ SOLN
INTRAMUSCULAR | Status: AC
Start: 2021-08-28 — End: 2021-08-29
  Filled 2021-08-28: qty 2

## 2021-08-28 MED ORDER — ONDANSETRON HCL 4 MG/2ML IJ SOLN: 4.0000 mg | Freq: Four times a day (QID) | INTRAMUSCULAR | Status: DC | PRN

## 2021-08-28 MED ORDER — ORAL CARE MOUTH RINSE: 15.0000 mL | Freq: Once | OROMUCOSAL | Status: AC

## 2021-08-28 MED ORDER — PANTOPRAZOLE SODIUM 40 MG IV SOLR
40.0000 mg | Freq: Every day | INTRAVENOUS | Status: DC
  Administered 2021-08-28: 40 mg via INTRAVENOUS
  Filled 2021-08-28: qty 10

## 2021-08-28 MED ORDER — BUPIVACAINE-EPINEPHRINE 0.25% -1:200000 IJ SOLN
INTRAMUSCULAR | Status: DC | PRN
  Administered 2021-08-28: 18 mL

## 2021-08-28 MED ORDER — ACETAMINOPHEN 500 MG PO TABS: 1000.0000 mg | ORAL_TABLET | ORAL | Status: AC

## 2021-08-28 MED ORDER — DEXMEDETOMIDINE (PRECEDEX) IN NS 20 MCG/5ML (4 MCG/ML) IV SYRINGE
PREFILLED_SYRINGE | INTRAVENOUS | Status: DC | PRN
  Administered 2021-08-28: 8 ug via INTRAVENOUS
  Administered 2021-08-28: 4 ug via INTRAVENOUS
  Administered 2021-08-28: 8 ug via INTRAVENOUS

## 2021-08-28 MED ORDER — SUCCINYLCHOLINE CHLORIDE 200 MG/10ML IV SOSY
PREFILLED_SYRINGE | INTRAVENOUS | Status: AC
  Filled 2021-08-28: qty 10

## 2021-08-28 MED ORDER — POTASSIUM CHLORIDE IN NACL 20-0.45 MEQ/L-% IV SOLN
INTRAVENOUS | Status: DC
  Filled 2021-08-28: qty 1000

## 2021-08-28 MED ORDER — DEXMEDETOMIDINE (PRECEDEX) IN NS 20 MCG/5ML (4 MCG/ML) IV SYRINGE
PREFILLED_SYRINGE | INTRAVENOUS | Status: AC
  Filled 2021-08-28: qty 5

## 2021-08-28 MED ORDER — MIDAZOLAM HCL 5 MG/5ML IJ SOLN
INTRAMUSCULAR | Status: DC | PRN
  Administered 2021-08-28: 2 mg via INTRAVENOUS

## 2021-08-28 MED ORDER — 0.9 % SODIUM CHLORIDE (POUR BTL) OPTIME
TOPICAL | Status: DC | PRN
  Administered 2021-08-28: 1000 mL

## 2021-08-28 MED ORDER — CHLORHEXIDINE GLUCONATE 0.12 % MT SOLN: 15.0000 mL | Freq: Once | OROMUCOSAL | Status: AC

## 2021-08-28 MED ORDER — CHLORHEXIDINE GLUCONATE 0.12 % MT SOLN
OROMUCOSAL | Status: AC
Start: 2021-08-28 — End: 2021-08-28
  Administered 2021-08-28: 15 mL via OROMUCOSAL
  Filled 2021-08-28: qty 15

## 2021-08-28 MED ORDER — SODIUM CHLORIDE 0.9 % IR SOLN
Status: DC | PRN
  Administered 2021-08-28: 1000 mL

## 2021-08-28 MED ORDER — ONDANSETRON HCL 4 MG/2ML IJ SOLN
INTRAMUSCULAR | Status: AC
  Filled 2021-08-28: qty 2

## 2021-08-28 MED ORDER — FENTANYL CITRATE (PF) 250 MCG/5ML IJ SOLN
INTRAMUSCULAR | Status: AC
  Filled 2021-08-28: qty 5

## 2021-08-28 MED ORDER — OXYCODONE HCL 5 MG PO TABS
5.0000 mg | ORAL_TABLET | ORAL | Status: DC | PRN
  Administered 2021-08-28 – 2021-08-29 (×2): 10 mg via ORAL
  Filled 2021-08-28 (×2): qty 2

## 2021-08-28 MED ORDER — SERTRALINE HCL 50 MG PO TABS: 50.0000 mg | ORAL_TABLET | Freq: Every day | ORAL | Status: DC

## 2021-08-28 MED ORDER — SUCCINYLCHOLINE CHLORIDE 200 MG/10ML IV SOSY
PREFILLED_SYRINGE | INTRAVENOUS | Status: DC | PRN
  Administered 2021-08-28: 200 mg via INTRAVENOUS

## 2021-08-28 MED ORDER — LIDOCAINE 2% (20 MG/ML) 5 ML SYRINGE
INTRAMUSCULAR | Status: DC | PRN
  Administered 2021-08-28: 50 mg via INTRAVENOUS

## 2021-08-28 MED ORDER — FENTANYL CITRATE (PF) 100 MCG/2ML IJ SOLN
25.0000 ug | INTRAMUSCULAR | Status: DC | PRN
  Administered 2021-08-28: 50 ug via INTRAVENOUS

## 2021-08-28 MED ORDER — FENTANYL CITRATE (PF) 250 MCG/5ML IJ SOLN
INTRAMUSCULAR | Status: DC | PRN
  Administered 2021-08-28 (×2): 50 ug via INTRAVENOUS
  Administered 2021-08-28: 100 ug via INTRAVENOUS
  Administered 2021-08-28: 50 ug via INTRAVENOUS

## 2021-08-28 MED ORDER — ROCURONIUM BROMIDE 10 MG/ML (PF) SYRINGE
PREFILLED_SYRINGE | INTRAVENOUS | Status: AC
  Filled 2021-08-28: qty 10

## 2021-08-28 MED ORDER — CYCLOBENZAPRINE HCL 5 MG PO TABS: 5.0000 mg | ORAL_TABLET | Freq: Three times a day (TID) | ORAL | Status: DC | PRN

## 2021-08-28 MED ORDER — PROPOFOL 10 MG/ML IV BOLUS
INTRAVENOUS | Status: AC
  Filled 2021-08-28: qty 20

## 2021-08-28 MED ORDER — LACTATED RINGERS IV SOLN: INTRAVENOUS | Status: DC

## 2021-08-28 MED ORDER — SUGAMMADEX SODIUM 200 MG/2ML IV SOLN
INTRAVENOUS | Status: DC | PRN
  Administered 2021-08-28: 400 mg via INTRAVENOUS

## 2021-08-28 MED ORDER — PROPOFOL 10 MG/ML IV BOLUS
INTRAVENOUS | Status: DC | PRN
  Administered 2021-08-28: 200 mg via INTRAVENOUS
  Administered 2021-08-28: 30 mg via INTRAVENOUS

## 2021-08-28 MED ORDER — GABAPENTIN 300 MG PO CAPS
ORAL_CAPSULE | ORAL | Status: AC
  Administered 2021-08-28: 300 mg via ORAL
  Filled 2021-08-28: qty 1

## 2021-08-28 SURGICAL SUPPLY — 46 items
APPLIER CLIP 5 13 M/L LIGAMAX5 (MISCELLANEOUS) ×2
APPLIER CLIP ROT 10 11.4 M/L (STAPLE) ×2
BAG COUNTER SPONGE SURGICOUNT (BAG) ×2 IMPLANT
CANISTER SUCT 3000ML PPV (MISCELLANEOUS) ×2 IMPLANT
CHLORAPREP W/TINT 26 (MISCELLANEOUS) ×2 IMPLANT
CLIP APPLIE 5 13 M/L LIGAMAX5 (MISCELLANEOUS) ×1 IMPLANT
CLIP APPLIE ROT 10 11.4 M/L (STAPLE) IMPLANT
CNTNR URN SCR LID CUP LEK RST (MISCELLANEOUS) IMPLANT
CONT SPEC 4OZ STRL OR WHT (MISCELLANEOUS) ×1
COVER SURGICAL LIGHT HANDLE (MISCELLANEOUS) ×2 IMPLANT
DERMABOND ADVANCED (GAUZE/BANDAGES/DRESSINGS) ×1
DERMABOND ADVANCED .7 DNX12 (GAUZE/BANDAGES/DRESSINGS) ×1 IMPLANT
ELECT REM PT RETURN 9FT ADLT (ELECTROSURGICAL) ×2
ELECTRODE REM PT RTRN 9FT ADLT (ELECTROSURGICAL) ×1 IMPLANT
GLOVE SRG 8 PF TXTR STRL LF DI (GLOVE) ×1 IMPLANT
GLOVE SURG ENC MOIS LTX SZ8 (GLOVE) ×2 IMPLANT
GLOVE SURG UNDER POLY LF SZ8 (GLOVE) ×1
GOWN STRL REUS W/ TWL LRG LVL3 (GOWN DISPOSABLE) ×2 IMPLANT
GOWN STRL REUS W/ TWL XL LVL3 (GOWN DISPOSABLE) ×1 IMPLANT
GOWN STRL REUS W/TWL LRG LVL3 (GOWN DISPOSABLE) ×2
GOWN STRL REUS W/TWL XL LVL3 (GOWN DISPOSABLE) ×1
KIT BASIN OR (CUSTOM PROCEDURE TRAY) ×2 IMPLANT
KIT TURNOVER KIT B (KITS) ×2 IMPLANT
L-HOOK LAP DISP 36CM (ELECTROSURGICAL) ×2
LHOOK LAP DISP 36CM (ELECTROSURGICAL) ×1 IMPLANT
NEEDLE 22X1 1/2 (OR ONLY) (NEEDLE) ×2 IMPLANT
NS IRRIG 1000ML POUR BTL (IV SOLUTION) ×2 IMPLANT
PAD ARMBOARD 7.5X6 YLW CONV (MISCELLANEOUS) ×2 IMPLANT
PENCIL BUTTON HOLSTER BLD 10FT (ELECTRODE) ×2 IMPLANT
POUCH RETRIEVAL ECOSAC 10 (ENDOMECHANICALS) ×1 IMPLANT
POUCH RETRIEVAL ECOSAC 10MM (ENDOMECHANICALS) ×1
SCISSORS LAP 5X35 DISP (ENDOMECHANICALS) ×2 IMPLANT
SET IRRIG TUBING LAPAROSCOPIC (IRRIGATION / IRRIGATOR) ×2 IMPLANT
SET TUBE SMOKE EVAC HIGH FLOW (TUBING) ×2 IMPLANT
SLEEVE ENDOPATH XCEL 5M (ENDOMECHANICALS) ×4 IMPLANT
SPECIMEN JAR SMALL (MISCELLANEOUS) ×2 IMPLANT
SUT VIC AB 4-0 PS2 27 (SUTURE) ×2 IMPLANT
TOWEL GREEN STERILE (TOWEL DISPOSABLE) ×2 IMPLANT
TOWEL GREEN STERILE FF (TOWEL DISPOSABLE) ×2 IMPLANT
TRAY LAPAROSCOPIC MC (CUSTOM PROCEDURE TRAY) ×2 IMPLANT
TROCAR 12M 150ML BLUNT (TROCAR) ×1 IMPLANT
TROCAR 5M 150ML BLDLS (TROCAR) ×2 IMPLANT
TROCAR BALLN GELPORT 12X130M (ENDOMECHANICALS) ×1 IMPLANT
TROCAR XCEL BLUNT TIP 100MML (ENDOMECHANICALS) ×2 IMPLANT
TROCAR XCEL NON-BLD 5MMX100MML (ENDOMECHANICALS) ×2 IMPLANT
WATER STERILE IRR 1000ML POUR (IV SOLUTION) ×2 IMPLANT

## 2021-08-28 NOTE — Progress Notes (Signed)
Received patient from PACU via wheelchair.  Patient is A&O x 4, able to ambulate to the bed. Gait and balance stable.  Assisted in position of comfort in bed.  Abdominal lap sites x 4 with skin glue, CDI.  Oriented to room and unit routine.  Needs addressed. ?

## 2021-08-28 NOTE — H&P (Signed)
Alyssa Woodard is an 26 y.o. female.   ?Chief Complaint: Symptomatic cholelithiasis ?HPI: Patient presents for laparoscopic cholecystectomy.  She has been feeling about the same since I saw her in the office.  No more severe attacks.  She is concerned that she does not have anyone to help her tonight after surgery and asked if she can stay over. ? ?Past Medical History:  ?Diagnosis Date  ? Anxiety   ? Depression   ? GERD (gastroesophageal reflux disease)   ? Headache   ? Medical history non-contributory   ? ? ?Past Surgical History:  ?Procedure Laterality Date  ? CESAREAN SECTION N/A 12/02/2019  ? Procedure: CESAREAN SECTION;  Surgeon: Catalina Antigua, MD;  Location: [facility] LD ORS;  Service: Obstetrics;  Laterality: N/A;  ? ? ?Family History  ?Problem Relation Age of Onset  ? Hypertension Mother   ? Breast cancer Maternal Aunt   ? Colon cancer Neg Hx   ? Esophageal cancer Neg Hx   ? Pancreatic cancer Neg Hx   ? Stomach cancer Neg Hx   ? Liver disease Neg Hx   ? ?Social History:  reports that she has quit smoking. Her smoking use included cigarettes. She has never used smokeless tobacco. She reports that she does not currently use alcohol. She reports current drug use. Frequency: 2.00 times per week. Drug: Marijuana. ? ?Allergies: No Known Allergies ? ?Medications Prior to Admission  ?Medication Sig Dispense Refill  ? cyclobenzaprine (FLEXERIL) 5 MG tablet Take 1-2 tablets (5-10 mg total) by mouth 3 (three) times daily as needed for muscle spasms. 50 tablet 1  ? Multiple Vitamin (MULTIVITAMIN WITH MINERALS) TABS tablet Take 1 tablet by mouth daily.    ? omeprazole (PRILOSEC) 40 MG capsule Take 1 capsule (40 mg total) by mouth 2 (two) times daily. 60 capsule 0  ? ondansetron (ZOFRAN-ODT) 4 MG disintegrating tablet Take 1 tablet (4 mg total) by mouth every 8 (eight) hours as needed for nausea or vomiting. 5 tablet 0  ? sertraline (ZOLOFT) 50 MG tablet Take 50 mg by mouth daily.    ? ? ?Results for orders placed or  performed during the hospital encounter of 08/28/21 (from the past 48 hour(s))  ?Pregnancy, urine POC     Status: None  ? Collection Time: 08/28/21  9:05 AM  ?Result Value Ref Range  ? Preg Test, Ur NEGATIVE NEGATIVE  ?  Comment:        ?THE SENSITIVITY OF THIS ?METHODOLOGY IS >24 mIU/mL ?  ? ?No results found. ? ?Review of Systems ? ?Blood pressure (!) 160/99, pulse 91, temperature 98.9 ?F (37.2 ?C), resp. rate 18, height 5\' 3"  (1.6 m), weight (!) 191.9 kg, last menstrual period 08/27/2021, SpO2 99 %, not currently breastfeeding. ?Physical Exam ?Constitutional:   ?   Appearance: She is obese.  ?HENT:  ?   Head: Normocephalic.  ?   Mouth/Throat:  ?   Mouth: Mucous membranes are moist.  ?Cardiovascular:  ?   Rate and Rhythm: Normal rate and regular rhythm.  ?Pulmonary:  ?   Effort: Pulmonary effort is normal.  ?   Breath sounds: Normal breath sounds.  ?Abdominal:  ?   General: Abdomen is flat. There is no distension.  ?   Palpations: Abdomen is soft.  ?   Tenderness: There is no abdominal tenderness.  ?Musculoskeletal:     ?   General: Normal range of motion.  ?   Cervical back: Normal range of motion.  ?Skin: ?  General: Skin is warm.  ?Neurological:  ?   General: No focal deficit present.  ?   Mental Status: She is alert.  ?Psychiatric:     ?   Mood and Affect: Mood normal.  ?  ? ?Assessment/Plan ?Symptomatic cholelithiasis -for laparoscopic cholecystectomy, possible IOC.  Procedure, risks, and benefits were discussed in detail with her.  She is agreeable.  We also discussed 23-hour admission because she reports she does not have the help from her family tonight. ? ?Liz Malady, MD ?08/28/2021, 9:44 AM ? ? ? ?

## 2021-08-28 NOTE — Transfer of Care (Signed)
Immediate Anesthesia Transfer of Care Note ? ?Patient: Alyssa Woodard ? ?Procedure(s) Performed: LAPAROSCOPIC CHOLECYSTECTOMY WITH INTRAOPERATIVE CHOLANGIOGRAM (Abdomen) ? ?Patient Location: PACU ? ?Anesthesia Type:General ? ?Level of Consciousness: drowsy and patient cooperative ? ?Airway & Oxygen Therapy: Patient Spontanous Breathing and Patient connected to face mask oxygen ? ?Post-op Assessment: Report given to RN, Post -op Vital signs reviewed and stable and Patient moving all extremities X 4 ? ?Post vital signs: Reviewed and stable ? ?Last Vitals:  ?Vitals Value Taken Time  ?BP 156/98 08/28/21 1134  ?Temp    ?Pulse 92 08/28/21 1135  ?Resp 21 08/28/21 1135  ?SpO2 100 % 08/28/21 1135  ?Vitals shown include unvalidated device data. ? ?Last Pain:  ?Vitals:  ? 08/28/21 0851  ?PainSc: 0-No pain  ?   ? ?  ? ?Complications: No notable events documented. ?

## 2021-08-28 NOTE — Anesthesia Procedure Notes (Signed)
Procedure Name: Intubation ?Date/Time: 08/28/2021 10:21 AM ?Performed by: Waynard Edwards, CRNA ?Pre-anesthesia Checklist: Patient identified, Emergency Drugs available, Suction available and Patient being monitored ?Patient Re-evaluated:Patient Re-evaluated prior to induction ?Oxygen Delivery Method: Circle system utilized ?Preoxygenation: Pre-oxygenation with 100% oxygen ?Induction Type: IV induction ?Ventilation: Mask ventilation without difficulty ?Laryngoscope Size: Hyacinth Meeker and 2 ?Grade View: Grade I ?Tube type: Oral ?Tube size: 7.5 mm ?Number of attempts: 1 ?Airway Equipment and Method: Stylet ?Placement Confirmation: ETT inserted through vocal cords under direct vision, positive ETCO2 and breath sounds checked- equal and bilateral ?Secured at: 23 cm ?Tube secured with: Tape ?Dental Injury: Teeth and Oropharynx as per pre-operative assessment  ? ? ? ? ?

## 2021-08-28 NOTE — Op Note (Signed)
?  08/28/2021 ? ?11:30 AM ? ?PATIENT:  Alyssa Woodard  26 y.o. female ? ?PRE-OPERATIVE DIAGNOSIS:  SYMPTOMATIC CHOLELITHIASIS ? ?POST-OPERATIVE DIAGNOSIS:  SYMPTOMATIC CHOLELITHIASIS ? ?PROCEDURE:  Procedure(s): ?LAPAROSCOPIC CHOLECYSTECTOMY  ? ?SURGEON:  Violeta Gelinas, MD ? ?ASSISTANTS: Darnell Level, MD  ? ?ANESTHESIA:   local and general ? ?EBL:  Total I/O ?In: 34 [IV Piggyback:50] ?Out: 5 [Blood:5] ? ?BLOOD ADMINISTERED:none ? ?DRAINS: none  ? ?SPECIMEN:  Excision ? ?DISPOSITION OF SPECIMEN:  PATHOLOGY ? ?COUNTS:  YES ? ?DICTATION: .Dragon Dictation ?Procedure in detail: Informed consent was obtained.  She received intravenous antibiotics.  She was brought to the operating room and general endotracheal anesthesia was administered by the anesthesia staff.  Her abdomen was prepped and draped in sterile fashion.  We did a timeout procedure.The supraumbilical region was infiltrated with local. Supraumbilical incision was made. Subcutaneous tissues were dissected down revealing the anterior fascia. This was divided sharply along the midline. Peritoneal cavity was entered under direct vision without complication. A 0 Vicryl pursestring was placed around the fascial opening. Hassan trocar was inserted into the abdomen. The abdomen was insufflated with carbon dioxide in standard fashion. Under direct vision a 5 mm epigastric and 5 mm right abdominal port x2 were placed.  Local was used at each port site.  The gallbladder was identified.  The dome was retracted superior medially.  The infundibulum was retracted inferior laterally.  Dissection began laterally and progressed medially easily identifying the cystic duct.  We obtained a critical view of safety and then placed clips proximally on the cystic duct.  A clip applier was not getting a good seat for the clips.  Therefore I upsized the epigastric port to a 12 and used the 10 mm clip applier.  This allowed me to place 3 proximal clips across the cystic duct and one  distal clip.  The cystic duct was divided.  There was excellent closure.  The cystic artery was then dissected out and clipped twice proximally.  It was divided distally with cautery.  The gallbladder was taken off the liver bed using cautery.  It was placed in a bag and removed from the abdomen.  It was sent to pathology.  The liver bed was cauterized to get excellent hemostasis.  The area was irrigated.  All clips remain in good position.  The cystic duct appeared well closed.  Irrigation fluid was evacuated.  The liver bed was dry.  Ports were removed under direct vision.  Pneumoperitoneum was released.  The supraumbilical fascia was closed by tying the pursestring.  All 4 wounds were irrigated and the skin of each was closed with running 4-0 Monocryl followed by Dermabond.  All counts were correct.  She tolerated the procedure well without apparent complication was taken recovery in stable condition. ? ?PATIENT DISPOSITION:  PACU - hemodynamically stable. ?  ?Delay start of Pharmacological VTE agent (>24hrs) due to surgical blood loss or risk of bleeding:  no ? ?Violeta Gelinas, MD, MPH, FACS ?Pager: [phone]  ?3/21/202311:30 AM ? ? ? ? ? ? ? ? ? ? ? ? ?  ?

## 2021-08-28 NOTE — Plan of Care (Signed)

## 2021-08-28 NOTE — Anesthesia Preprocedure Evaluation (Signed)
Anesthesia Evaluation  ?Patient identified by MRN, date of birth, ID band ?Patient awake ? ? ? ?Reviewed: ?Allergy & Precautions, H&P , NPO status , Patient's Chart, lab work & pertinent test results ? ?Airway ?Mallampati: II ? ? ?Neck ROM: full ? ? ? Dental ?  ?Pulmonary ?former smoker,  ?  ?breath sounds clear to auscultation ? ? ? ? ? ? Cardiovascular ?negative cardio ROS ? ? ?Rhythm:regular Rate:Normal ? ? ?  ?Neuro/Psych ? Headaches, PSYCHIATRIC DISORDERS Anxiety Depression   ? GI/Hepatic ?GERD  ,  ?Endo/Other  ?Morbid obesity ? Renal/GU ?  ? ?  ?Musculoskeletal ? ? Abdominal ?  ?Peds ? Hematology ?  ?Anesthesia Other Findings ? ? Reproductive/Obstetrics ? ?  ? ? ? ? ? ? ? ? ? ? ? ? ? ?  ?  ? ? ? ? ? ? ? ? ?Anesthesia Physical ?Anesthesia Plan ? ?ASA: 3 ? ?Anesthesia Plan: General  ? ?Post-op Pain Management:   ? ?Induction: Intravenous ? ?PONV Risk Score and Plan: 3 and Ondansetron, Dexamethasone, Midazolam and Treatment may vary due to age or medical condition ? ?Airway Management Planned: Oral ETT and Video Laryngoscope Planned ? ?Additional Equipment:  ? ?Intra-op Plan:  ? ?Post-operative Plan: Extubation in OR ? ?Informed Consent: I have reviewed the patients History and Physical, chart, labs and discussed the procedure including the risks, benefits and alternatives for the proposed anesthesia with the patient or authorized representative who has indicated his/her understanding and acceptance.  ? ? ? ?Dental advisory given ? ?Plan Discussed with: CRNA, Anesthesiologist and Surgeon ? ?Anesthesia Plan Comments:   ? ? ? ? ? ? ?Anesthesia Quick Evaluation ? ?

## 2021-08-29 ENCOUNTER — Encounter (HOSPITAL_COMMUNITY): Payer: Self-pay | Admitting: General Surgery

## 2021-08-29 DIAGNOSIS — K8010 Calculus of gallbladder with chronic cholecystitis without obstruction: Secondary | ICD-10-CM | POA: Diagnosis not present

## 2021-08-29 LAB — SURGICAL PATHOLOGY

## 2021-08-29 MED ORDER — OXYCODONE HCL 5 MG PO TABS: 5.0000 mg | ORAL_TABLET | Freq: Four times a day (QID) | ORAL | 0 refills | Status: DC | PRN

## 2021-08-29 NOTE — Progress Notes (Signed)
Nsg Discharge Note ? ?Admit Date:  08/28/2021 ?Discharge date: 08/29/2021 ?  ?Alyssa Woodard to be D/C'd Home per MD order.  AVS completed.  Copy for chart, and copy for patient signed, and dated. Removed IV-CDI. Reviewed d/c paperwork with patient and friend. Answered all questions. Stable patient and belongings wheeled to main entrance where she was picked up by her friend. ?Patient/caregiver able to verbalize understanding. ? ?Discharge Medication: ?Allergies as of 08/29/2021   ?No Known Allergies ?  ? ?  ?Medication List  ?  ? ?TAKE these medications   ? ?cyclobenzaprine 5 MG tablet ?Commonly known as: FLEXERIL ?Take 1-2 tablets (5-10 mg total) by mouth 3 (three) times daily as needed for muscle spasms. ?  ?multivitamin with minerals Tabs tablet ?Take 1 tablet by mouth daily. ?  ?omeprazole 40 MG capsule ?Commonly known as: PRILOSEC ?Take 1 capsule (40 mg total) by mouth 2 (two) times daily. ?  ?ondansetron 4 MG disintegrating tablet ?Commonly known as: ZOFRAN-ODT ?Take 1 tablet (4 mg total) by mouth every 8 (eight) hours as needed for nausea or vomiting. ?  ?oxyCODONE 5 MG immediate release tablet ?Commonly known as: Oxy IR/ROXICODONE ?Take 1 tablet (5 mg total) by mouth every 6 (six) hours as needed for severe pain. ?  ?sertraline 50 MG tablet ?Commonly known as: ZOLOFT ?Take 50 mg by mouth daily. ?  ? ?  ? ? ?Discharge Assessment: ?Vitals:  ? 08/29/21 0400 08/29/21 0747  ?BP: (!) 149/91 (!) 113/59  ?Pulse: 72 87  ?Resp: 20 16  ?Temp: 97.7 ?F (36.5 ?C) 98.3 ?F (36.8 ?C)  ?SpO2: 100% 96%  ? Skin clean, dry and intact without evidence of skin break down, no evidence of skin tears noted. ?IV catheter discontinued intact. Site without signs and symptoms of complications - no redness or edema noted at insertion site, patient denies c/o pain - only slight tenderness at site.  Dressing with slight pressure applied. ? ?D/c Instructions-Education: ?Discharge instructions given to patient/family with verbalized  understanding. ?D/c education completed with patient/family including follow up instructions, medication list, d/c activities limitations if indicated, with other d/c instructions as indicated by MD - patient able to verbalize understanding, all questions fully answered. ?Patient instructed to return to ED, call 911, or call MD for any changes in condition.  ?Patient escorted via WC, and D/C home via private auto. ? ?Karolee Ohs, RN ?08/29/2021 8:52 AM  ?

## 2021-08-29 NOTE — Anesthesia Postprocedure Evaluation (Signed)
Anesthesia Post Note ? ?Patient: Alyssa Woodard ? ?Procedure(s) Performed: LAPAROSCOPIC CHOLECYSTECTOMY WITH INTRAOPERATIVE CHOLANGIOGRAM (Abdomen) ? ?  ? ?Patient location during evaluation: PACU ?Anesthesia Type: General ?Level of consciousness: awake and alert ?Pain management: pain level controlled ?Vital Signs Assessment: post-procedure vital signs reviewed and stable ?Respiratory status: spontaneous breathing, nonlabored ventilation, respiratory function stable and patient connected to nasal cannula oxygen ?Cardiovascular status: blood pressure returned to baseline and stable ?Postop Assessment: no apparent nausea or vomiting ?Anesthetic complications: no ? ? ?No notable events documented. ? ?Last Vitals:  ?Vitals:  ? 08/29/21 0400 08/29/21 0747  ?BP: (!) 149/91 (!) 113/59  ?Pulse: 72 87  ?Resp: 20 16  ?Temp: 36.5 ?C 36.8 ?C  ?SpO2: 100% 96%  ?  ?Last Pain:  ?Vitals:  ? 08/29/21 0848  ?TempSrc:   ?PainSc: 2   ? ? ?  ?  ?  ?  ?  ?  ? ?Edger Husain S ? ? ? ? ?

## 2021-08-29 NOTE — Discharge Summary (Signed)
Physician Discharge Summary  ?Patient ID: ?Alyssa Woodard ?MRN: [id number] ?DOB/AGE: May 07, 1996 25 y.o. ? ?Admit date: 08/28/2021 ?Discharge date: 08/29/2021 ? ?Admission Diagnoses: ? ?Discharge Diagnoses:  ?Principal Problem: ?  S/P laparoscopic cholecystectomy ? ? ?Discharged Condition: good ? ?Hospital Course: Patient underwent laparoscopic cholecystectomy. SHe did well post-op and is ready for D/C. ? ?Consults: None ? ?Significant Diagnostic Studies: . ? ?Treatments: surgery above ? ?Discharge Exam: ?Blood pressure (!) 113/59, pulse 87, temperature 98.3 ?F (36.8 ?C), temperature source Oral, resp. rate 16, height 5\' 3"  (1.6 m), weight (!) 184.5 kg, last menstrual period 08/27/2021, SpO2 96 %, not currently breastfeeding. ?General appearance: alert and cooperative ?Resp: clear to auscultation bilaterally ?Cardio: regular rate and rhythm ?GI: soft, incisions CDI ? ?Disposition: Discharge disposition: 01-Home or Self Care ? ? ? ? ? ? ?Discharge Instructions   ? ? Call MD for:  redness, tenderness, or signs of infection (pain, swelling, redness, odor or green/yellow discharge around incision site)   Complete by: As directed ?  ? Diet - low sodium heart healthy   Complete by: As directed ?  ? Discharge instructions   Complete by: As directed ?  ? [facility] ______[facility] ?LAPAROSCOPIC SURGERY: POST OP INSTRUCTIONS ?Always review your discharge instruction sheet given to you by the facility where your surgery was performed. ?IF YOU HAVE DISABILITY OR FAMILY LEAVE FORMS, YOU MUST BRING THEM TO THE OFFICE FOR PROCESSING.   ?DO NOT GIVE THEM TO YOUR DOCTOR. ? ?A prescription for pain medication may be given to you upon discharge.  Take your pain medication as prescribed, if needed.  If narcotic pain medicine is not needed, then you may take acetaminophen (Tylenol) or ibuprofen (Advil) as needed. ?Take your usually prescribed medications unless otherwise directed. ?If you need a refill on your pain  medication, please contact your pharmacy.  They will contact our office to request authorization. Prescriptions will not be filled after 5pm or on week-ends. ?You should follow a light diet the first few days after arrival home, such as soup and crackers, etc.  Be sure to include lots of fluids daily. ?Most patients will experience some swelling and bruising in the area of the incisions.  Ice packs will help.  Swelling and bruising can take several days to resolve.  ?It is common to experience some constipation if taking pain medication after surgery.  Increasing fluid intake and taking a stool softener (such as Colace) will usually help or prevent this problem from occurring.  A mild laxative (Milk of Magnesia or Miralax) should be taken according to package instructions if there are no bowel movements after 48 hours. ?Unless discharge instructions indicate otherwise, you may remove your bandages 24-48 hours after surgery, and you may shower at that time.  You may have steri-strips (small skin tapes) in place directly over the incision.  These strips should be left on the skin for 7-10 days.  If your surgeon used skin glue on the incision, you may shower in 24 hours.  The glue will flake off over the next 2-3 weeks.  Any sutures or staples will be removed at the office during your follow-up visit. ?ACTIVITIES:  You may resume regular (light) daily activities beginning the next day-such as daily self-care, walking, climbing stairs-gradually increasing activities as tolerated.  You may have sexual intercourse when it is comfortable.  Refrain from any heavy lifting or straining until approved by your doctor. ?You may drive when you are no longer taking prescription pain  medication, you can comfortably wear a seatbelt, and you can safely maneuver your car and apply brakes. ?RETURN TO WORK:  __________________________________________________________ ?You should see your doctor in the office for a follow-up appointment  approximately 2-3 weeks after your surgery.  Make sure that you call for this appointment within a day or two after you arrive home to insure a convenient appointment time. ?OTHER INSTRUCTIONS: __________________________________________________________________________________________________________________________ __________________________________________________________________________________________________________________________ ?WHEN TO CALL YOUR DOCTOR: ?Fever over 101.0 ?Inability to urinate ?Continued bleeding from incision. ?Increased pain, redness, or drainage from the incision. ?Increasing abdominal pain ? ?The clinic staff is available to answer your questions during regular business hours.  Please don't hesitate to call and ask to speak to one of the nurses for clinical concerns.  If you have a medical emergency, go to the nearest emergency room or call 911.  A surgeon from [facility] is always on call at the hospital. ?[address], [place], [place]  [address] ? [address] [address], [place], [place]   [address] ?[phone] ? [phone] ? FAX [phone] ?Web site: [email or web address]  ? Increase activity slowly   Complete by: As directed ?  ? Lifting restrictions   Complete by: As directed ?  ? No lifting over 10lbs for 4 weeks  ? ?  ? ?Allergies as of 08/29/2021   ?No Known Allergies ?  ? ?  ?Medication List  ?  ? ?TAKE these medications   ? ?cyclobenzaprine 5 MG tablet ?Commonly known as: FLEXERIL ?Take 1-2 tablets (5-10 mg total) by mouth 3 (three) times daily as needed for muscle spasms. ?  ?multivitamin with minerals Tabs tablet ?Take 1 tablet by mouth daily. ?  ?omeprazole 40 MG capsule ?Commonly known as: PRILOSEC ?Take 1 capsule (40 mg total) by mouth 2 (two) times daily. ?  ?ondansetron 4 MG disintegrating tablet ?Commonly known as: ZOFRAN-ODT ?Take 1 tablet (4 mg total) by mouth every 8 (eight) hours as needed for nausea or vomiting. ?  ?oxyCODONE 5  MG immediate release tablet ?Commonly known as: Oxy IR/ROXICODONE ?Take 1 tablet (5 mg total) by mouth every 6 (six) hours as needed for severe pain. ?  ?sertraline 50 MG tablet ?Commonly known as: ZOLOFT ?Take 50 mg by mouth daily. ?  ? ?  ? ? ? ?Signed: ?Liz Malady ?08/29/2021, 7:49 AM ? ? ?

## 2021-08-29 NOTE — Plan of Care (Signed)
  Problem: Education: Goal: Knowledge of General Education information will improve Description Including pain rating scale, medication(s)/side effects and non-pharmacologic comfort measures Outcome: Progressing   Problem: Health Behavior/Discharge Planning: Goal: Ability to manage health-related needs will improve Outcome: Progressing   

## 2021-08-29 NOTE — Plan of Care (Signed)
?  Problem: Education: ?Goal: Knowledge of General Education information will improve ?Description: Including pain rating scale, medication(s)/side effects and non-pharmacologic comfort measures ?08/29/2021 0752 by Karolee Ohs, RN ?Outcome: Adequate for Discharge ?08/29/2021 0752 by Karolee Ohs, RN ?Outcome: Progressing ?  ?Problem: Health Behavior/Discharge Planning: ?Goal: Ability to manage health-related needs will improve ?08/29/2021 0752 by Karolee Ohs, RN ?Outcome: Adequate for Discharge ?08/29/2021 0752 by Karolee Ohs, RN ?Outcome: Progressing ?  ?Problem: Clinical Measurements: ?Goal: Ability to maintain clinical measurements within normal limits will improve ?Outcome: Adequate for Discharge ?Goal: Will remain free from infection ?Outcome: Adequate for Discharge ?Goal: Diagnostic test results will improve ?Outcome: Adequate for Discharge ?Goal: Respiratory complications will improve ?Outcome: Adequate for Discharge ?Goal: Cardiovascular complication will be avoided ?Outcome: Adequate for Discharge ?  ?Problem: Activity: ?Goal: Risk for activity intolerance will decrease ?Outcome: Adequate for Discharge ?  ?Problem: Nutrition: ?Goal: Adequate nutrition will be maintained ?Outcome: Adequate for Discharge ?  ?Problem: Coping: ?Goal: Level of anxiety will decrease ?Outcome: Adequate for Discharge ?  ?Problem: Elimination: ?Goal: Will not experience complications related to bowel motility ?Outcome: Adequate for Discharge ?Goal: Will not experience complications related to urinary retention ?Outcome: Adequate for Discharge ?  ?Problem: Pain Managment: ?Goal: General experience of comfort will improve ?Outcome: Adequate for Discharge ?  ?Problem: Safety: ?Goal: Ability to remain free from injury will improve ?Outcome: Adequate for Discharge ?  ?Problem: Skin Integrity: ?Goal: Risk for impaired skin integrity will decrease ?Outcome: Adequate for Discharge ?  ?

## 2021-10-16 ENCOUNTER — Ambulatory Visit: Payer: Medicaid Other | Admitting: Nurse Practitioner

## 2021-10-25 ENCOUNTER — Ambulatory Visit: Payer: Medicaid Other | Admitting: Nurse Practitioner

## 2022-03-27 ENCOUNTER — Encounter (HOSPITAL_COMMUNITY): Payer: Self-pay | Admitting: Emergency Medicine

## 2022-03-27 ENCOUNTER — Ambulatory Visit (HOSPITAL_COMMUNITY)
Admission: EM | Admit: 2022-03-27 | Discharge: 2022-03-27 | Disposition: A | Discharge status: Home / Self Care | Payer: Medicaid Other | Attending: Emergency Medicine | Admitting: Emergency Medicine

## 2022-03-27 DIAGNOSIS — A084 Viral intestinal infection, unspecified: Secondary | ICD-10-CM | POA: Diagnosis present

## 2022-03-27 DIAGNOSIS — Z1152 Encounter for screening for COVID-19: Secondary | ICD-10-CM | POA: Insufficient documentation

## 2022-03-27 MED ORDER — ONDANSETRON 4 MG PO TBDP: 4.0000 mg | ORAL_TABLET | Freq: Three times a day (TID) | ORAL | 0 refills | Status: DC | PRN

## 2022-03-27 NOTE — ED Triage Notes (Signed)
Pt reports abd pains and n/v since Saturday. Denies bowel problems.

## 2022-03-27 NOTE — Discharge Instructions (Signed)
Your symptoms are most likely caused by a virus, it will work its way out your system over the next few days  The test is pending, you will be notified of positive test results only, if positive as your symptoms have been present for 5 days you have already met quarantine and may continue activity as tolerated wearing a mask if you still have symptoms  You can use zofran every 8 hours as needed for nausea, be mindful this medication may make you drowsy, take the first dose at home to see how it affects your body  You can use over-the-counter ibuprofen or Tylenol, which ever you have at home, to help manage fevers  Continue to promote hydration throughout the day by using electrolyte replacement solution such as Gatorade, body armor, Pedialyte, which ever you have at home  Try eating bland foods such as bread, rice, toast, fruit which are easier on the stomach to digest, avoid foods that are overly spicy, overly seasoned or greasy

## 2022-03-27 NOTE — ED Provider Notes (Signed)
[place]    CSN: [id number] Arrival date & time: 03/27/22  1118      History   Chief Complaint Chief Complaint  Patient presents with   Abdominal Pain   Emesis    HPI Alyssa Woodard is a 26 y.o. female.   Patient presents with generalized abdominal pain and vomiting for 5 days.  Last occurrence this morning, emesis is described as food and bile.  Abdominal pain is described as  constant and aching.  No known sick contacts.  Difficulty tolerating food and fluids.  Has not attempted treatment of symptoms.  History of a cholecystectomy.  Denies fever, chills, diarrhea, bloating.    Past Medical History:  Diagnosis Date   Anxiety    Depression    GERD (gastroesophageal reflux disease)    Headache     Patient Active Problem List   Diagnosis Date Noted   S/P laparoscopic cholecystectomy 08/28/2021   Breech presentation 12/02/2019   Morbid obesity with BMI of 70 and over, adult ([place]) 12/02/2019   [redacted] weeks gestation of pregnancy 12/02/2019   S/P cesarean section 12/02/2019    Past Surgical History:  Procedure Laterality Date   CESAREAN SECTION N/A 12/02/2019   Procedure: CESAREAN SECTION;  Surgeon: Mora Bellman, MD;  Location: [facility];  Service: Obstetrics;  Laterality: N/A;   CHOLECYSTECTOMY N/A 08/28/2021   Procedure: LAPAROSCOPIC CHOLECYSTECTOMY WITH INTRAOPERATIVE CHOLANGIOGRAM;  Surgeon: Georganna Skeans, MD;  Location: [facility];  Service: General;  Laterality: N/A;    OB History     Gravida  1   Para  1   Term  1   Preterm      AB      Living  1      SAB      IAB      Ectopic      Multiple  0   Live Births  1            Home Medications    Prior to Admission medications   Medication Sig Start Date End Date Taking? Authorizing Provider  cyclobenzaprine (FLEXERIL) 5 MG tablet Take 1-2 tablets (5-10 mg total) by mouth 3 (three) times daily as needed for muscle spasms. 07/17/21   Armbruster, Carlota Raspberry, MD  Multiple Vitamin  (MULTIVITAMIN WITH MINERALS) TABS tablet Take 1 tablet by mouth daily.    [provider]  omeprazole (PRILOSEC) 40 MG capsule Take 1 capsule (40 mg total) by mouth 2 (two) times daily. 07/17/21 08/16/21  Armbruster, Carlota Raspberry, MD  ondansetron (ZOFRAN-ODT) 4 MG disintegrating tablet Take 1 tablet (4 mg total) by mouth every 8 (eight) hours as needed for nausea or vomiting. 07/22/21   Petrucelli, Samantha R, PA-C  oxyCODONE (OXY IR/ROXICODONE) 5 MG immediate release tablet Take 1 tablet (5 mg total) by mouth every 6 (six) hours as needed for severe pain. 08/29/21   Georganna Skeans, MD  sertraline (ZOLOFT) 50 MG tablet Take 50 mg by mouth daily.    [provider]    Family History Family History  Problem Relation Age of Onset   Hypertension Mother    Breast cancer Maternal Aunt    Colon cancer Neg Hx    Esophageal cancer Neg Hx    Pancreatic cancer Neg Hx    Stomach cancer Neg Hx    Liver disease Neg Hx     Social History Social History   Tobacco Use   Smoking status: Former    Types: Cigarettes  Smokeless tobacco: Never   Tobacco comments:    none since 04/22/2019  Vaping Use   Vaping Use: Never used  Substance Use Topics   Alcohol use: Not Currently    Comment: socially   Drug use: Yes    Frequency: 2.0 times per week    Types: Marijuana    Comment: smokes marijuana     Allergies   Patient has no known allergies.   Review of Systems Review of Systems  Constitutional: Negative.   HENT: Negative.    Respiratory: Negative.    Cardiovascular: Negative.   Gastrointestinal:  Positive for abdominal pain and vomiting. Negative for abdominal distention, anal bleeding, blood in stool, constipation, diarrhea, nausea and rectal pain.     Physical Exam Triage Vital Signs ED Triage Vitals  Enc Vitals Group     BP 03/27/22 1218 130/66     Pulse Rate 03/27/22 1218 70     Resp 03/27/22 1218 20     Temp 03/27/22 1218 98.6 F (37 C)     Temp Source 03/27/22  1218 Oral     SpO2 03/27/22 1218 100 %     Weight --      Height --      Head Circumference --      Peak Flow --      Pain Score 03/27/22 1217 8     Pain Loc --      Pain Edu? --      Excl. in GC? --    No data found.  Updated Vital Signs BP 130/66 (BP Location: Left Arm)   Pulse 70   Temp 98.6 F (37 C) (Oral)   Resp 20   SpO2 100%   Visual Acuity Right Eye Distance:   Left Eye Distance:   Bilateral Distance:    Right Eye Near:   Left Eye Near:    Bilateral Near:     Physical Exam Constitutional:      Appearance: She is well-developed.  Pulmonary:     Effort: Pulmonary effort is normal.  Abdominal:     General: Abdomen is flat. Bowel sounds are increased.     Palpations: Abdomen is soft.     Tenderness: There is generalized abdominal tenderness.  Skin:    General: Skin is warm and dry.  Neurological:     General: No focal deficit present.     Mental Status: She is alert and oriented to person, place, and time.  Psychiatric:        Mood and Affect: Mood normal.        Behavior: Behavior normal.      UC Treatments / Results  Labs (all labs ordered are listed, but only abnormal results are displayed) Labs Reviewed - No data to display  EKG   Radiology No results found.  Procedures Procedures (including critical care time)  Medications Ordered in UC Medications - No data to display  Initial Impression / Assessment and Plan / UC Course  I have reviewed the triage vital signs and the nursing notes.  Pertinent labs & imaging results that were available during my care of the patient were reviewed by me and considered in my medical decision making (see chart for details).  Viral gastroenteritis  Vital signs are stable and patient is in no signs of distress nor toxic appearing, generalized tenderness is noted on exam, low suspicion for an acute abdomen at this time, prescribe Zofran for outpatient use and advised increase fluid intake until symptoms  have resolved, may follow-up if symptoms persist or worsen, work note given Final Clinical Impressions(s) / UC Diagnoses   Final diagnoses:  None   Discharge Instructions   None    ED Prescriptions   None    PDMP not reviewed this encounter.   Valinda Hoar, NP 03/27/22 1234

## 2022-03-28 LAB — SARS CORONAVIRUS 2 (TAT 6-24 HRS): SARS Coronavirus 2: NEGATIVE

## 2022-06-10 NOTE — L&D Delivery Note (Signed)
Delivery Note Alyssa Woodard is a 27 y.o. G2P1001 at [redacted]w[redacted]d admitted for SOL/ROM w/ IUFD. Intrapartum GHTN.   GBS Status:  unknown Maximum Maternal Temperature: 98.7  Labor course: Initial SVE: 4/90/-1. Augmentation with: Pitocin. She then progressed to complete.  SROM: unknown date/time with thick meconium stained fluid  Called by RN to come, pt on hands & knees and baby coming out as I entered room  Birth: At 0119 a non-viable female was delivered via spontaneous vaginal delivery (Presentation: uncertain). Nuchal cord present: Yes, loose.  Shoulders and body delivered in usual fashion. Infant w/ meconium stained skin placed directly on mom's abdomen for bonding/skin-to-skin, baby dried and stimulated. Meconium stained cord clamped x 2 after and cut by me.  Infant taken to warmer by RN, and pt assisted to lithotomy. The placenta separated spontaneously and delivered via gentle cord traction.  Pitocin infused rapidly IV per protocol.  Fundus firm with massage.  Placenta inspected and appears to be intact with a 3 VC.  Placenta/Cord with the following complications: meconium stained .  Cord pH: n/a Sponge and instrument count were correct x2.  Intrapartum complications:  IUFD Anesthesia:  epidural and PCA pump Episiotomy: none Lacerations:  1st degree Suture Repair: 3.0 vicryl EBL (mL):   Infant: APGAR (1 MIN):  0 APGAR (5 MINS):  0 APGAR (10 MINS):   0 Infant weight: pending  Delivery Report: Review the Delivery Report for details.    Mom to postpartum.  Baby to [place]. Placenta to Pathology for IUFD    Contraception: Nexplanon  Note sent to [facility]: [facility] for pp visit.  Cheral Marker CNM, [facility] 11/13/2022 1:55 AM

## 2022-11-12 ENCOUNTER — Inpatient Hospital Stay (HOSPITAL_COMMUNITY): Payer: Medicaid Other | Admitting: Anesthesiology

## 2022-11-12 ENCOUNTER — Inpatient Hospital Stay (HOSPITAL_BASED_OUTPATIENT_CLINIC_OR_DEPARTMENT_OTHER): Payer: Medicaid Other

## 2022-11-12 ENCOUNTER — Encounter (HOSPITAL_COMMUNITY): Payer: Self-pay | Admitting: Emergency Medicine

## 2022-11-12 ENCOUNTER — Inpatient Hospital Stay (HOSPITAL_COMMUNITY)
Admission: AD | Admit: 2022-11-12 | Discharge: 2022-11-14 | DRG: 805 | Disposition: A | Discharge status: Home / Self Care | Payer: Medicaid Other | Attending: Obstetrics and Gynecology | Admitting: Obstetrics and Gynecology

## 2022-11-12 ENCOUNTER — Other Ambulatory Visit: Payer: Self-pay

## 2022-11-12 ENCOUNTER — Inpatient Hospital Stay (HOSPITAL_COMMUNITY): Payer: Medicaid Other

## 2022-11-12 DIAGNOSIS — Z23 Encounter for immunization: Secondary | ICD-10-CM

## 2022-11-12 DIAGNOSIS — Z7985 Long-term (current) use of injectable non-insulin antidiabetic drugs: Secondary | ICD-10-CM | POA: Diagnosis not present

## 2022-11-12 DIAGNOSIS — B9689 Other specified bacterial agents as the cause of diseases classified elsewhere: Secondary | ICD-10-CM | POA: Diagnosis present

## 2022-11-12 DIAGNOSIS — Z3687 Encounter for antenatal screening for uncertain dates: Secondary | ICD-10-CM | POA: Diagnosis not present

## 2022-11-12 DIAGNOSIS — O364XX0 Maternal care for intrauterine death, not applicable or unspecified: Principal | ICD-10-CM | POA: Diagnosis present

## 2022-11-12 DIAGNOSIS — O99214 Obesity complicating childbirth: Secondary | ICD-10-CM | POA: Diagnosis present

## 2022-11-12 DIAGNOSIS — Z3A36 36 weeks gestation of pregnancy: Secondary | ICD-10-CM | POA: Diagnosis not present

## 2022-11-12 DIAGNOSIS — Z30017 Encounter for initial prescription of implantable subdermal contraceptive: Secondary | ICD-10-CM

## 2022-11-12 DIAGNOSIS — Z87891 Personal history of nicotine dependence: Secondary | ICD-10-CM

## 2022-11-12 DIAGNOSIS — O134 Gestational [pregnancy-induced] hypertension without significant proteinuria, complicating childbirth: Secondary | ICD-10-CM | POA: Diagnosis present

## 2022-11-12 DIAGNOSIS — O0933 Supervision of pregnancy with insufficient antenatal care, third trimester: Secondary | ICD-10-CM

## 2022-11-12 DIAGNOSIS — O34211 Maternal care for low transverse scar from previous cesarean delivery: Secondary | ICD-10-CM | POA: Diagnosis not present

## 2022-11-12 LAB — CBC
HCT: 35.6 % — ABNORMAL LOW (ref 36.0–46.0)
HCT: 35.1 % — ABNORMAL LOW (ref 36.0–46.0)
Hemoglobin: 12.1 g/dL (ref 12.0–15.0)
Hemoglobin: 11.7 g/dL — ABNORMAL LOW (ref 12.0–15.0)
MCH: 33.1 pg (ref 26.0–34.0)
MCH: 32.1 pg (ref 26.0–34.0)
MCHC: 34 g/dL (ref 30.0–36.0)
MCHC: 33.3 g/dL (ref 30.0–36.0)
MCV: 97.3 fL (ref 80.0–100.0)
MCV: 96.2 fL (ref 80.0–100.0)
Platelets: 343 10*3/uL (ref 150–400)
Platelets: 365 10*3/uL (ref 150–400)
RBC: 3.66 MIL/uL — ABNORMAL LOW (ref 3.87–5.11)
RBC: 3.65 MIL/uL — ABNORMAL LOW (ref 3.87–5.11)
RDW: 12.6 % (ref 11.5–15.5)
RDW: 12.8 % (ref 11.5–15.5)
WBC: 12.2 10*3/uL — ABNORMAL HIGH (ref 4.0–10.5)
WBC: 11.7 10*3/uL — ABNORMAL HIGH (ref 4.0–10.5)
nRBC: 0 % (ref 0.0–0.2)
nRBC: 0 % (ref 0.0–0.2)

## 2022-11-12 LAB — WET PREP, GENITAL
Sperm: NONE SEEN
Trich, Wet Prep: NONE SEEN
WBC, Wet Prep HPF POC: <10 (ref <10)
Yeast Wet Prep HPF POC: NONE SEEN

## 2022-11-12 LAB — COMPREHENSIVE METABOLIC PANEL
ALT: 19 U/L (ref 0–44)
AST: 20 U/L (ref 15–41)
Albumin: 2.8 g/dL — ABNORMAL LOW (ref 3.5–5.0)
Alkaline Phosphatase: 89 U/L (ref 38–126)
Anion gap: 10 (ref 5–15)
BUN: 13 mg/dL (ref 6–20)
CO2: 20 mmol/L — ABNORMAL LOW (ref 22–32)
Calcium: 9.3 mg/dL (ref 8.9–10.3)
Chloride: 106 mmol/L (ref 98–111)
Creatinine, Ser: 0.77 mg/dL (ref 0.44–1.00)
GFR, Estimated: >60 mL/min (ref >60)
Glucose, Bld: 97 mg/dL (ref 70–99)
Potassium: 3.9 mmol/L (ref 3.5–5.1)
Sodium: 136 mmol/L (ref 135–145)
Total Bilirubin: 0.6 mg/dL (ref 0.3–1.2)
Total Protein: 6.4 g/dL — ABNORMAL LOW (ref 6.5–8.1)

## 2022-11-12 LAB — URINALYSIS, ROUTINE W REFLEX MICROSCOPIC
Bilirubin Urine: NEGATIVE
Glucose, UA: NEGATIVE mg/dL
Hgb urine dipstick: NEGATIVE
Ketones, ur: NEGATIVE mg/dL
Leukocytes,Ua: NEGATIVE
Nitrite: NEGATIVE
Protein, ur: NEGATIVE mg/dL
Specific Gravity, Urine: 1.023 (ref 1.005–1.030)
pH: 6 (ref 5.0–8.0)

## 2022-11-12 LAB — TYPE AND SCREEN
ABO/RH(D): O POS
Antibody Screen: NEGATIVE

## 2022-11-12 LAB — HIV ANTIBODY (ROUTINE TESTING W REFLEX): HIV Screen 4th Generation wRfx: NONREACTIVE

## 2022-11-12 LAB — I-STAT BETA HCG BLOOD, ED (MC, WL, AP ONLY): I-stat hCG, quantitative: 326.9 m[IU]/mL — ABNORMAL HIGH (ref <5)

## 2022-11-12 LAB — FIBRINOGEN: Fibrinogen: 675 mg/dL — ABNORMAL HIGH (ref 210–475)

## 2022-11-12 LAB — HEPATITIS B SURFACE ANTIGEN: Hepatitis B Surface Ag: NONREACTIVE

## 2022-11-12 LAB — PROTEIN / CREATININE RATIO, URINE
Creatinine, Urine: 186 mg/dL
Protein Creatinine Ratio: 0.1 mg/mg{Cre} (ref 0.00–0.15)
Total Protein, Urine: 18 mg/dL

## 2022-11-12 LAB — PROTIME-INR
INR: 1 (ref 0.8–1.2)
Prothrombin Time: 13.2 seconds (ref 11.4–15.2)

## 2022-11-12 LAB — LIPASE, BLOOD: Lipase: 22 U/L (ref 11–51)

## 2022-11-12 LAB — APTT: aPTT: 25 seconds (ref 24–36)

## 2022-11-12 LAB — HCG, QUANTITATIVE, PREGNANCY: hCG, Beta Chain, Quant, S: 318 m[IU]/mL — ABNORMAL HIGH (ref <5)

## 2022-11-12 MED ORDER — ONDANSETRON HCL 4 MG/2ML IJ SOLN: 4.0000 mg | Freq: Four times a day (QID) | INTRAMUSCULAR | Status: DC | PRN

## 2022-11-12 MED ORDER — OXYCODONE-ACETAMINOPHEN 5-325 MG PO TABS: 2.0000 | ORAL_TABLET | ORAL | Status: DC | PRN

## 2022-11-12 MED ORDER — LIDOCAINE HCL (PF) 1 % IJ SOLN
30.0000 mL | INTRAMUSCULAR | Status: DC | PRN
  Filled 2022-11-12: qty 30

## 2022-11-12 MED ORDER — FENTANYL CITRATE (PF) 100 MCG/2ML IJ SOLN
INTRAMUSCULAR | Status: AC
  Filled 2022-11-12: qty 2

## 2022-11-12 MED ORDER — EPHEDRINE 5 MG/ML INJ: 10.0000 mg | INTRAVENOUS | Status: DC | PRN

## 2022-11-12 MED ORDER — OXYTOCIN BOLUS FROM INFUSION: 333.0000 mL | Freq: Once | INTRAVENOUS | Status: DC

## 2022-11-12 MED ORDER — OXYCODONE-ACETAMINOPHEN 5-325 MG PO TABS: 1.0000 | ORAL_TABLET | ORAL | Status: DC | PRN

## 2022-11-12 MED ORDER — BUPIVACAINE HCL (PF) 0.25 % IJ SOLN
INTRAMUSCULAR | Status: DC | PRN
  Administered 2022-11-12: 8 mL via EPIDURAL

## 2022-11-12 MED ORDER — FENTANYL CITRATE (PF) 100 MCG/2ML IJ SOLN
INTRAMUSCULAR | Status: DC | PRN
  Administered 2022-11-12 (×2): 100 ug via EPIDURAL

## 2022-11-12 MED ORDER — DIPHENHYDRAMINE HCL 12.5 MG/5ML PO ELIX: 12.5000 mg | ORAL_SOLUTION | Freq: Four times a day (QID) | ORAL | Status: DC | PRN

## 2022-11-12 MED ORDER — SOD CITRATE-CITRIC ACID 500-334 MG/5ML PO SOLN: 30.0000 mL | ORAL | Status: DC | PRN

## 2022-11-12 MED ORDER — DIPHENHYDRAMINE HCL 50 MG/ML IJ SOLN: 12.5000 mg | INTRAMUSCULAR | Status: DC | PRN

## 2022-11-12 MED ORDER — ONDANSETRON HCL 4 MG/2ML IJ SOLN
4.0000 mg | Freq: Four times a day (QID) | INTRAMUSCULAR | Status: DC | PRN
  Administered 2022-11-12: 4 mg via INTRAVENOUS

## 2022-11-12 MED ORDER — LACTATED RINGERS IV SOLN: 500.0000 mL | INTRAVENOUS | Status: DC | PRN

## 2022-11-12 MED ORDER — ACETAMINOPHEN 325 MG PO TABS: 650.0000 mg | ORAL_TABLET | ORAL | Status: DC | PRN

## 2022-11-12 MED ORDER — NALOXONE HCL 0.4 MG/ML IJ SOLN: 0.4000 mg | INTRAMUSCULAR | Status: DC | PRN

## 2022-11-12 MED ORDER — LACTATED RINGERS IV SOLN
500.0000 mL | Freq: Once | INTRAVENOUS | Status: AC
  Administered 2022-11-12: 500 mL via INTRAVENOUS

## 2022-11-12 MED ORDER — PHENYLEPHRINE 80 MCG/ML (10ML) SYRINGE FOR IV PUSH (FOR BLOOD PRESSURE SUPPORT): 80.0000 ug | PREFILLED_SYRINGE | INTRAVENOUS | Status: DC | PRN

## 2022-11-12 MED ORDER — OXYTOCIN-SODIUM CHLORIDE 30-0.9 UT/500ML-% IV SOLN: 2.5000 [IU]/h | INTRAVENOUS | Status: DC

## 2022-11-12 MED ORDER — FENTANYL 50 MCG/ML IV PCA SOLN
INTRAVENOUS | Status: DC
  Filled 2022-11-12: qty 25

## 2022-11-12 MED ORDER — TERBUTALINE SULFATE 1 MG/ML IJ SOLN: 0.2500 mg | Freq: Once | INTRAMUSCULAR | Status: DC | PRN

## 2022-11-12 MED ORDER — OXYTOCIN-SODIUM CHLORIDE 30-0.9 UT/500ML-% IV SOLN
1.0000 m[IU]/min | INTRAVENOUS | Status: DC
  Administered 2022-11-12: 2 m[IU]/min via INTRAVENOUS
  Filled 2022-11-12: qty 500

## 2022-11-12 MED ORDER — SODIUM CHLORIDE 0.9% FLUSH: 9.0000 mL | INTRAVENOUS | Status: DC | PRN

## 2022-11-12 MED ORDER — FENTANYL CITRATE (PF) 100 MCG/2ML IJ SOLN
100.0000 ug | Freq: Once | INTRAMUSCULAR | Status: AC
  Administered 2022-11-12: 100 ug via EPIDURAL

## 2022-11-12 MED ORDER — LIDOCAINE HCL (PF) 1 % IJ SOLN
INTRAMUSCULAR | Status: DC | PRN
  Administered 2022-11-12: 11 mL via EPIDURAL

## 2022-11-12 MED ORDER — LIDOCAINE-EPINEPHRINE (PF) 2 %-1:200000 IJ SOLN
INTRAMUSCULAR | Status: DC | PRN
  Administered 2022-11-12: 10 mL via EPIDURAL

## 2022-11-12 MED ORDER — FENTANYL-BUPIVACAINE-NACL 0.5-0.125-0.9 MG/250ML-% EP SOLN
12.0000 mL/h | EPIDURAL | Status: DC | PRN
  Administered 2022-11-12: 12 mL/h via EPIDURAL
  Filled 2022-11-12: qty 250

## 2022-11-12 MED ORDER — LACTATED RINGERS IV SOLN: INTRAVENOUS | Status: DC

## 2022-11-12 MED ORDER — DIPHENHYDRAMINE HCL 50 MG/ML IJ SOLN: 12.5000 mg | Freq: Four times a day (QID) | INTRAMUSCULAR | Status: DC | PRN

## 2022-11-12 NOTE — Anesthesia Preprocedure Evaluation (Signed)
Anesthesia Evaluation  Patient identified by MRN, date of birth, ID band Patient awake    Reviewed: Allergy & Precautions, NPO status , Patient's Chart, lab work & pertinent test results  History of Anesthesia Complications Negative for: history of anesthetic complications  Airway Mallampati: III  TM Distance: >3 FB Neck ROM: Full    Dental no notable dental hx.    Pulmonary former smoker   Pulmonary exam normal        Cardiovascular negative cardio ROS Normal cardiovascular exam     Neuro/Psych  Headaches  Anxiety Depression     negative psych ROS   GI/Hepatic negative GI ROS, Neg liver ROS,,,  Endo/Other    Morbid obesity (BMI 79)  Renal/GU negative Renal ROS  negative genitourinary   Musculoskeletal negative musculoskeletal ROS (+)    Abdominal  (+) + obese  Peds  Hematology Hgb 11.7, plt 426   Anesthesia Other Findings Day of surgery medications reviewed with patient.  Reproductive/Obstetrics (+) Pregnancy                             Anesthesia Physical Anesthesia Plan  ASA: IV  Anesthesia Plan: Epidural   Post-op Pain Management:    Induction:   PONV Risk Score and Plan: Ondansetron and Dexamethasone  Airway Management Planned: Natural Airway  Additional Equipment: None  Intra-op Plan:   Post-operative Plan:   Informed Consent: I have reviewed the patients History and Physical, chart, labs and discussed the procedure including the risks, benefits and alternatives for the proposed anesthesia with the patient or authorized representative who has indicated his/her understanding and acceptance.       Plan Discussed with:   Anesthesia Plan Comments:         Anesthesia Quick Evaluation

## 2022-11-12 NOTE — MAU Note (Signed)
Alyssa Woodard is a 27 y.o. at Unknown here in MAU reporting: she's having abdominal pain located around navel that travels up to upper abdomen and radiates into her back.  Reports when she's lying down she sees a lump or knot on the left side under her left breast.  Reports pain kept her awake all night.  States she hasn't had any sexual activity with anyone.  "I haven't had any sexual action with no man and that's the [employer] truth". Denies current VB LMP: unsure Onset of complaint: yesterday Pain score: 10 Vitals:   11/12/22 0800 11/12/22 1134  BP: (!) 146/96 (!) 136/91  Pulse: (!) 104 85  Resp: 20 19  Temp: 98.7 F (37.1 C) 97.9 F (36.6 C)  SpO2: 99% 98%     FHT:NA Lab orders placed from triage:

## 2022-11-12 NOTE — Progress Notes (Signed)
Labor Progress Note  Alyssa Woodard is a 27 y.o. G2P1001 at [redacted]w[redacted]d presented for SROM unsure timeframe/ IUFD  S: pt has PCA pump 2/2 Epidural not working.   O:  BP (!) 156/43   Pulse 98   Temp 98.3 F (36.8 C) (Oral)   Resp (!) 23   Ht 5\' 1"  (1.549 m)   Wt (!) 174.9 kg   LMP  (LMP Unknown)   SpO2 100%   BMI 72.86 kg/m  EFM:n/a Toco: regular, every 2-3 minutes   CVE: Dilation: 7 Effacement (%): 90 Station: -1 Presentation: Vertex Exam by:: Boone Master, RN   A&P: 27 y.o. G2P1001 [redacted]w[redacted]d  here for SROM/IUFD as above  #Labor: Progressing well. Continue pitocin for contraction pattern q2-3 min #Pain: PCA pump [name]: DIC labs wnl, PreE labs nml, Prenatal labs and infectious/coagulopathy labs pending. No signs of systemic infection at this time #Bacterial Vaginosis: start Metronidazole pp  Myrtie Hawk, DO FMOB Fellow, Faculty practice [facility], Center for [facility] 11/12/22  11:56 PM

## 2022-11-12 NOTE — MAU Provider Note (Signed)
History    CSN: [id number]   Arrival date and time: 11/12/22 0751    Event Date/Time   First Provider Initiated Contact with Patient 11/12/22 1158          Chief Complaint  Patient presents with   Abdominal Pain    26 y.o. G2P1001 @unknown  gestation presenting for abdominal pain and hard lump in abdomen. Pain started abouut 1-2 weeks ago. Pain is mostly left mid and upper quadrants and feels like squeezing and twisting. Rates pain 10/10. She denies the possibility of being pregnant and reports "no sexual action with a man". She is unsure of her LMP and denies VB.        OB History       Gravida  2   Para  1   Term  1   Preterm      AB      Living  1        SAB      IAB      Ectopic      Multiple  0   Live Births  1                   Past Medical History:  Diagnosis Date   Anxiety     Depression     GERD (gastroesophageal reflux disease)     Headache             Past Surgical History:  Procedure Laterality Date   CESAREAN SECTION N/A 12/02/2019    Procedure: CESAREAN SECTION;  Surgeon: Catalina Antigua, MD;  Location: [facility];  Service: Obstetrics;  Laterality: N/A;   CHOLECYSTECTOMY N/A 08/28/2021    Procedure: LAPAROSCOPIC CHOLECYSTECTOMY WITH INTRAOPERATIVE CHOLANGIOGRAM;  Surgeon: Violeta Gelinas, MD;  Location: [facility] OR;  Service: General;  Laterality: N/A;           Family History  Problem Relation Age of Onset   Hypertension Mother     Breast cancer Maternal Aunt     Colon cancer Neg Hx     Esophageal cancer Neg Hx     Pancreatic cancer Neg Hx     Stomach cancer Neg Hx     Liver disease Neg Hx        Social History         Tobacco Use   Smoking status: Former      Types: Cigarettes   Smokeless tobacco: Never   Tobacco comments:      none since 04/22/2019  Vaping Use   Vaping Use: Never used  Substance Use Topics   Alcohol use: Not Currently      Comment: socially   Drug use: Yes      Frequency: 2.0 times per week       Types: Marijuana      Comment: smokes marijuana      Allergies: No Known Allergies          Medications Prior to Admission  Medication Sig Dispense Refill Last Dose   dicyclomine (BENTYL) 10 MG/5ML solution Take 10 mg by mouth 4 (four) times daily -  before meals and at bedtime.     11/11/2022   omeprazole (PRILOSEC) 40 MG capsule Take 40 mg by mouth in the morning and at bedtime.     11/11/2022   ondansetron (ZOFRAN-ODT) 4 MG disintegrating tablet Take 1 tablet (4 mg total) by mouth every 8 (eight) hours as needed for nausea or vomiting. 5 tablet 0 11/11/2022   Semaglutide (  OZEMPIC, 1 MG/DOSE, [place]) Inject 1 mg into the skin once a week.     Past Week   sertraline (ZOLOFT) 50 MG tablet Take 50 mg by mouth daily.     11/11/2022   omeprazole (PRILOSEC) 40 MG capsule Take 1 capsule (40 mg total) by mouth 2 (two) times daily. 60 capsule 0        Review of Systems  Gastrointestinal:  Positive for abdominal pain.  Genitourinary:  Negative for vaginal bleeding.    Physical Exam    Blood pressure (!) 136/91, pulse 85, temperature 97.9 F (36.6 C), temperature source Oral, resp. rate 19, height 5\' 1"  (1.549 m), weight (!) 174.9 kg, SpO2 98 %.  Patient Vitals for the past 24 hrs:  BP Temp Temp src Pulse Resp SpO2 Height Weight  11/12/22 1134 (!) 136/91 97.9 F (36.6 C) Oral 85 19 98 % -- --  11/12/22 1125 -- -- -- -- -- -- 5\' 1"  (1.549 m) (!) 174.9 kg  11/12/22 0808 -- -- -- -- -- -- 5\' 3"  (1.6 m) (!) 177.4 kg  11/12/22 0800 (!) 146/96 98.7 F (37.1 C) -- (!) 104 20 99 % -- --    Physical Exam Vitals and nursing note reviewed.  Constitutional:      General: She is not in acute distress (appears comfortable).    Appearance: Normal appearance.  HENT:     Head: Normocephalic and atraumatic.  Cardiovascular:     Rate and Rhythm: Normal rate.  Pulmonary:     Effort: Pulmonary effort is normal. No respiratory distress.  Abdominal:     Palpations: Abdomen is soft. There is mass (large firm mass  encompassing right and left upper quadrants).     Tenderness: There is no abdominal tenderness. There is no guarding or rebound.     Hernia: No hernia is present.  Musculoskeletal:        General: Normal range of motion.     Cervical back: Normal range of motion.  Skin:    General: Skin is warm and dry.  Neurological:     General: No focal deficit present.     Mental Status: She is alert and oriented to person, place, and time.  Psychiatric:        Mood and Affect: Mood normal.        Behavior: Behavior normal.      Lab Results Last 24 Hours       Results for orders placed or performed during the hospital encounter of 11/12/22 (from the past 24 hour(s))  Lipase, blood     Status: None    Collection Time: 11/12/22  8:18 AM  Result Value Ref Range    Lipase 22 11 - 51 U/L  Comprehensive metabolic panel     Status: Abnormal    Collection Time: 11/12/22  8:18 AM  Result Value Ref Range    Sodium 136 135 - 145 mmol/L    Potassium 3.9 3.5 - 5.1 mmol/L    Chloride 106 98 - 111 mmol/L    CO2 20 (L) 22 - 32 mmol/L    Glucose, Bld 97 70 - 99 mg/dL    BUN 13 6 - 20 mg/dL    Creatinine, Ser 1.61 0.44 - 1.00 mg/dL    Calcium 9.3 8.9 - 09.6 mg/dL    Total Protein 6.4 (L) 6.5 - 8.1 g/dL    Albumin 2.8 (L) 3.5 - 5.0 g/dL    AST 20 15 - 41 U/L  ALT 19 0 - 44 U/L    Alkaline Phosphatase 89 38 - 126 U/L    Total Bilirubin 0.6 0.3 - 1.2 mg/dL    GFR, Estimated >96 >04 mL/min    Anion gap 10 5 - 15  CBC     Status: Abnormal    Collection Time: 11/12/22  8:18 AM  Result Value Ref Range    WBC 12.2 (H) 4.0 - 10.5 K/uL    RBC 3.66 (L) 3.87 - 5.11 MIL/uL    Hemoglobin 12.1 12.0 - 15.0 g/dL    HCT 54.0 (L) 98.1 - 46.0 %    MCV 97.3 80.0 - 100.0 fL    MCH 33.1 26.0 - 34.0 pg    MCHC 34.0 30.0 - 36.0 g/dL    RDW 19.1 47.8 - 29.5 %    Platelets 343 150 - 400 K/uL    nRBC 0.0 0.0 - 0.2 %  I-Stat beta hCG blood, ED     Status: Abnormal    Collection Time: 11/12/22  8:43 AM  Result Value  Ref Range    I-stat hCG, quantitative 326.9 (H) <5 mIU/mL    Comment 3           Urinalysis, Routine w reflex microscopic -Urine, Clean Catch     Status: None    Collection Time: 11/12/22  9:59 AM  Result Value Ref Range    Color, Urine YELLOW YELLOW    APPearance CLEAR CLEAR    Specific Gravity, Urine 1.023 1.005 - 1.030    pH 6.0 5.0 - 8.0    Glucose, UA NEGATIVE NEGATIVE mg/dL    Hgb urine dipstick NEGATIVE NEGATIVE    Bilirubin Urine NEGATIVE NEGATIVE    Ketones, ur NEGATIVE NEGATIVE mg/dL    Protein, ur NEGATIVE NEGATIVE mg/dL    Nitrite NEGATIVE NEGATIVE    Leukocytes,Ua NEGATIVE NEGATIVE      No results found.    MAU Course  Procedures   MDM Labs and imaging ordered. [place] confirms 36 wk IUFD per Dr. Judeth Cornfield. Discussed findings with pt and mother (on facetime), condolences given.  SVE 4/70/-2, vtx, thick MSF. Denies LOF.  Plan for IOL and VBAC.    Assessment and Plan  [redacted] weeks gestation IUFD Admit to LD Mngt per labor team- notified Dr. Nobie Putnam and Dr. Alfonso Ramus [facility] 11/12/2022, 12:07 PM

## 2022-11-12 NOTE — Progress Notes (Signed)
Patient assisted with a full bed bath after epidural placement. Given supplies and set-up for oral care/ teeth brushing.

## 2022-11-12 NOTE — ED Notes (Signed)
Pt attempting get urine sample while in lobby

## 2022-11-12 NOTE — ED Triage Notes (Signed)
Pt reports she feels a lump or a knot in her abdomen. Pt reports pain is worse with movement. Pt reports several episodes of vomting. Denies fever, diarrhea. LMP unknown.

## 2022-11-12 NOTE — Anesthesia Procedure Notes (Signed)
Epidural Patient location during procedure: OB Start time: 11/12/2022 4:57 PM End time: 11/12/2022 5:12 PM  Staffing Anesthesiologist: Lowella Curb, MD Performed: anesthesiologist   Preanesthetic Checklist Completed: patient identified, IV checked, site marked, risks and benefits discussed, surgical consent, monitors and equipment checked, pre-op evaluation and timeout performed  Epidural Patient position: sitting Prep: ChloraPrep Patient monitoring: heart rate, cardiac monitor, continuous pulse ox and blood pressure Approach: midline Location: L2-L3 Injection technique: LOR saline  Needle:  Needle type: Tuohy  Needle gauge: 17 G Needle length: 9 cm Needle insertion depth: 12 cm Catheter type: closed end flexible Catheter size: 20 Guage Catheter at skin depth: 20 cm Test dose: negative  Assessment Events: blood not aspirated, injection not painful, no injection resistance, no paresthesia and negative IV test  Additional Notes Loss with 9cm Tuohy but with tenting approximately 3cm of skin and tissue into the back.  Estimate LOR to be about 12cmReason for block:procedure for pain

## 2022-11-12 NOTE — H&P (Signed)
OBSTETRIC ADMISSION HISTORY AND PHYSICAL  Alyssa Woodard is a 27 y.o. female G23P1101 with IUP at [redacted]w[redacted]d by unknown presenting for IUFD. She reports +FMs, No LOF, no VB, no blurry vision, headaches or peripheral edema, and RUQ pain. She request nexplanon for birth control. She received her prenatal care at  [facility]    Dating: [place] --->  Estimated Date of Delivery: 12/09/22  Sono:    @[redacted]w[redacted]d , CWD, normal anatomy, cephalic presentation,    Prenatal History/Complications: NPC, Hx CS for breech. Intellectual disability   Past Medical History: Past Medical History:  Diagnosis Date   Anxiety    Depression    GERD (gastroesophageal reflux disease)    Headache     Past Surgical History: Past Surgical History:  Procedure Laterality Date   CESAREAN SECTION N/A 12/02/2019   Procedure: CESAREAN SECTION;  Surgeon: Catalina Antigua, MD;  Location: [facility];  Service: Obstetrics;  Laterality: N/A;   CHOLECYSTECTOMY N/A 08/28/2021   Procedure: LAPAROSCOPIC CHOLECYSTECTOMY WITH INTRAOPERATIVE CHOLANGIOGRAM;  Surgeon: Violeta Gelinas, MD;  Location[facility];  Service: General;  Laterality: N/A;    Obstetrical History: OB History     Gravida  2   Para  2   Term  1   Preterm  1   AB      Living  1      SAB      IAB      Ectopic      Multiple  0   Live Births  1           Social History Social History   Socioeconomic History   Marital status: Single    Spouse name: Not on file   Number of children: Not on file   Years of education: Not on file   Highest education level: Not on file  Occupational History   Not on file  Tobacco Use   Smoking status: Former    Types: Cigarettes   Smokeless tobacco: Never   Tobacco comments:    none since 04/22/2019  Vaping Use   Vaping Use: Never used  Substance and Sexual Activity   Alcohol use: Not Currently    Comment: socially   Drug use: Yes    Frequency: 2.0 times per week    Types: Marijuana    Comment: smokes marijuana    Sexual activity: Not Currently    Comment: hasn't been to get depo shot in 3 months.  Other Topics Concern   Not on file  Social History Narrative   Not on file   Social Determinants of Health   Financial Resource Strain: Not on file  Food Insecurity: No Food Insecurity (11/12/2022)   Hunger Vital Sign    Worried About Running Out of Food in the Last Year: Never true    Ran Out of Food in the Last Year: Never true  Transportation Needs: No Transportation Needs (11/12/2022)   PRAPARE - Administrator, Civil Service (Medical): No    Lack of Transportation (Non-Medical): No  Physical Activity: Not on file  Stress: Not on file  Social Connections: Not on file    Family History: Family History  Problem Relation Age of Onset   Hypertension Mother    Breast cancer Maternal Aunt    Colon cancer Neg Hx    Esophageal cancer Neg Hx    Pancreatic cancer Neg Hx    Stomach cancer Neg Hx    Liver disease Neg Hx  Allergies: No Known Allergies  Medications Prior to Admission  Medication Sig Dispense Refill Last Dose   dicyclomine (BENTYL) 10 MG/5ML solution Take 10 mg by mouth 4 (four) times daily -  before meals and at bedtime.   11/11/2022   omeprazole (PRILOSEC) 40 MG capsule Take 40 mg by mouth in the morning and at bedtime.   11/11/2022   ondansetron (ZOFRAN-ODT) 4 MG disintegrating tablet Take 1 tablet (4 mg total) by mouth every 8 (eight) hours as needed for nausea or vomiting. 5 tablet 0 11/11/2022   Semaglutide (OZEMPIC, 1 MG/DOSE, [place]) Inject 1 mg into the skin once a week.   Past Week   sertraline (ZOLOFT) 50 MG tablet Take 50 mg by mouth daily.   11/11/2022   omeprazole (PRILOSEC) 40 MG capsule Take 1 capsule (40 mg total) by mouth 2 (two) times daily. 60 capsule 0      Review of Systems   All systems reviewed and negative except as stated in HPI  Blood pressure (!) 106/43, pulse 89, temperature 97.9 F (36.6 C), temperature source Oral, resp. rate 16, height 5\' 1"   (1.549 m), weight (!) 174.9 kg, SpO2 99 %. General appearance: alert, cooperative, appears stated age, and no distress Lungs: Normal work of breathing  Heart: regular rate and rhythm Presentation: cephalic Dilation: 10 Effacement (%): 100 Station: Plus 3 Exam by:: Boone Master   Prenatal labs: ABO, Rh: --/--/O POS (06/04 1415) Antibody: NEG (06/04 1415) Rubella: 3.68 (06/04 1415) RPR:    HBsAg: NON REACTIVE (06/04 1415)  HIV: Non Reactive (06/04 1415)  GBS:     Prenatal Transfer Tool  Maternal Diabetes: No Number of Prenatal Visits:Less than or equal to 3 verified prenatal visits Other Comments:  None  Results for orders placed or performed during the hospital encounter of 11/12/22 (from the past 24 hour(s))  Lipase, blood   Collection Time: 11/12/22  8:18 AM  Result Value Ref Range   Lipase 22 11 - 51 U/L  Comprehensive metabolic panel   Collection Time: 11/12/22  8:18 AM  Result Value Ref Range   Sodium 136 135 - 145 mmol/L   Potassium 3.9 3.5 - 5.1 mmol/L   Chloride 106 98 - 111 mmol/L   CO2 20 (L) 22 - 32 mmol/L   Glucose, Bld 97 70 - 99 mg/dL   BUN 13 6 - 20 mg/dL   Creatinine, Ser 1.61 0.44 - 1.00 mg/dL   Calcium 9.3 8.9 - 09.6 mg/dL   Total Protein 6.4 (L) 6.5 - 8.1 g/dL   Albumin 2.8 (L) 3.5 - 5.0 g/dL   AST 20 15 - 41 U/L   ALT 19 0 - 44 U/L   Alkaline Phosphatase 89 38 - 126 U/L   Total Bilirubin 0.6 0.3 - 1.2 mg/dL   GFR, Estimated >04 >54 mL/min   Anion gap 10 5 - 15  CBC   Collection Time: 11/12/22  8:18 AM  Result Value Ref Range   WBC 12.2 (H) 4.0 - 10.5 K/uL   RBC 3.66 (L) 3.87 - 5.11 MIL/uL   Hemoglobin 12.1 12.0 - 15.0 g/dL   HCT 09.8 (L) 11.9 - 14.7 %   MCV 97.3 80.0 - 100.0 fL   MCH 33.1 26.0 - 34.0 pg   MCHC 34.0 30.0 - 36.0 g/dL   RDW 82.9 56.2 - 13.0 %   Platelets 343 150 - 400 K/uL   nRBC 0.0 0.0 - 0.2 %  hCG, quantitative, pregnancy   Collection Time: 11/12/22  8:18 AM  Result Value Ref Range   hCG, Beta Chain, Quant, S 318 (H) <5  mIU/mL  Hemoglobin A1c   Collection Time: 11/12/22  8:18 AM  Result Value Ref Range   Hgb A1c MFr Bld 4.9 4.8 - 5.6 %   Mean Plasma Glucose 94 mg/dL  I-Stat beta hCG blood, ED   Collection Time: 11/12/22  8:43 AM  Result Value Ref Range   I-stat hCG, quantitative 326.9 (H) <5 mIU/mL   Comment 3          Urinalysis, Routine w reflex microscopic -Urine, Clean Catch   Collection Time: 11/12/22  9:59 AM  Result Value Ref Range   Color, Urine YELLOW YELLOW   APPearance CLEAR CLEAR   Specific Gravity, Urine 1.023 1.005 - 1.030   pH 6.0 5.0 - 8.0   Glucose, UA NEGATIVE NEGATIVE mg/dL   Hgb urine dipstick NEGATIVE NEGATIVE   Bilirubin Urine NEGATIVE NEGATIVE   Ketones, ur NEGATIVE NEGATIVE mg/dL   Protein, ur NEGATIVE NEGATIVE mg/dL   Nitrite NEGATIVE NEGATIVE   Leukocytes,Ua NEGATIVE NEGATIVE  Protein / creatinine ratio, urine   Collection Time: 11/12/22  9:59 AM  Result Value Ref Range   Creatinine, Urine 186 mg/dL   Total Protein, Urine 18 mg/dL   Protein Creatinine Ratio 0.10 0.00 - 0.15 mg/mg[Cre]  Wet prep, genital   Collection Time: 11/12/22 12:18 PM  Result Value Ref Range   Yeast Wet Prep HPF POC NONE SEEN NONE SEEN   Trich, Wet Prep NONE SEEN NONE SEEN   Clue Cells Wet Prep HPF POC PRESENT (A) NONE SEEN   WBC, Wet Prep HPF POC <10 <10   Sperm NONE SEEN   APTT   Collection Time: 11/12/22  2:15 PM  Result Value Ref Range   aPTT 25 24 - 36 seconds  Fibrinogen   Collection Time: 11/12/22  2:15 PM  Result Value Ref Range   Fibrinogen 675 (H) 210 - 475 mg/dL  Hepatitis B surface antigen   Collection Time: 11/12/22  2:15 PM  Result Value Ref Range   Hepatitis B Surface Ag NON REACTIVE NON REACTIVE  HIV Antibody (routine testing w rflx)   Collection Time: 11/12/22  2:15 PM  Result Value Ref Range   HIV Screen 4th Generation wRfx Non Reactive Non Reactive  Protime-INR   Collection Time: 11/12/22  2:15 PM  Result Value Ref Range   Prothrombin Time 13.2 11.4 - 15.2  seconds   INR 1.0 0.8 - 1.2  Rubella screen   Collection Time: 11/12/22  2:15 PM  Result Value Ref Range   Rubella 3.68 Immune >0.99 index  CBC   Collection Time: 11/12/22  2:15 PM  Result Value Ref Range   WBC 11.7 (H) 4.0 - 10.5 K/uL   RBC 3.65 (L) 3.87 - 5.11 MIL/uL   Hemoglobin 11.7 (L) 12.0 - 15.0 g/dL   HCT 16.1 (L) 09.6 - 04.5 %   MCV 96.2 80.0 - 100.0 fL   MCH 32.1 26.0 - 34.0 pg   MCHC 33.3 30.0 - 36.0 g/dL   RDW 40.9 81.1 - 91.4 %   Platelets 365 150 - 400 K/uL   nRBC 0.0 0.0 - 0.2 %  Type and screen   Collection Time: 11/12/22  2:15 PM  Result Value Ref Range   ABO/RH(D) O POS    Antibody Screen NEG    Sample Expiration      11/15/2022,2359 Performed at [facility], [address],  [place], [place] [address]     Patient Active Problem List   Diagnosis Date Noted   IUFD at 20 weeks or more of gestation 11/12/2022   S/P laparoscopic cholecystectomy 08/28/2021   Breech presentation 12/02/2019   Morbid obesity with BMI of 70 and over, adult (HCC) 12/02/2019   [redacted] weeks gestation of pregnancy 12/02/2019   S/P cesarean section 12/02/2019    Assessment/Plan:  Alyssa Woodard is a 27 y.o. G2P1101 at [redacted]w[redacted]d here for SROM with 36 wk IUFD. Patient reports she did not know she was pregnant.   #Labor:Will get patient comfortable with epidural and start pitocin  #Pain: Epidural  #MOC:Nexplanon   Celedonio Savage, MD  11/13/2022, 7:59 AM

## 2022-11-13 ENCOUNTER — Encounter (HOSPITAL_COMMUNITY): Payer: Self-pay | Admitting: Obstetrics and Gynecology

## 2022-11-13 DIAGNOSIS — Z3A36 36 weeks gestation of pregnancy: Secondary | ICD-10-CM

## 2022-11-13 DIAGNOSIS — O134 Gestational [pregnancy-induced] hypertension without significant proteinuria, complicating childbirth: Secondary | ICD-10-CM

## 2022-11-13 DIAGNOSIS — O34211 Maternal care for low transverse scar from previous cesarean delivery: Secondary | ICD-10-CM

## 2022-11-13 DIAGNOSIS — O364XX0 Maternal care for intrauterine death, not applicable or unspecified: Secondary | ICD-10-CM

## 2022-11-13 LAB — GC/CHLAMYDIA PROBE AMP (~~LOC~~) NOT AT ARMC
Chlamydia: NEGATIVE
Comment: NEGATIVE
Comment: NORMAL
Neisseria Gonorrhea: NEGATIVE

## 2022-11-13 LAB — RPR: RPR Ser Ql: NONREACTIVE

## 2022-11-13 LAB — HEMOGLOBIN A1C
Hgb A1c MFr Bld: 4.9 % (ref 4.8–5.6)
Mean Plasma Glucose: 94 mg/dL

## 2022-11-13 LAB — RUBELLA SCREEN: Rubella: 3.68 index (ref >0.99)

## 2022-11-13 MED ORDER — IBUPROFEN 600 MG PO TABS
600.0000 mg | ORAL_TABLET | Freq: Four times a day (QID) | ORAL | Status: DC
  Administered 2022-11-13 – 2022-11-14 (×6): 600 mg via ORAL
  Filled 2022-11-13 (×6): qty 1

## 2022-11-13 MED ORDER — ONDANSETRON HCL 4 MG PO TABS: 4.0000 mg | ORAL_TABLET | ORAL | Status: DC | PRN

## 2022-11-13 MED ORDER — BENZOCAINE-MENTHOL 20-0.5 % EX AERO: 1.0000 | INHALATION_SPRAY | CUTANEOUS | Status: DC | PRN

## 2022-11-13 MED ORDER — PANTOPRAZOLE SODIUM 40 MG PO TBEC
40.0000 mg | DELAYED_RELEASE_TABLET | Freq: Two times a day (BID) | ORAL | Status: DC
  Administered 2022-11-13 – 2022-11-14 (×2): 40 mg via ORAL
  Filled 2022-11-13 (×2): qty 1

## 2022-11-13 MED ORDER — FUROSEMIDE 20 MG PO TABS
20.0000 mg | ORAL_TABLET | Freq: Every day | ORAL | Status: DC
  Administered 2022-11-13 – 2022-11-14 (×2): 20 mg via ORAL
  Filled 2022-11-13 (×2): qty 1

## 2022-11-13 MED ORDER — SIMETHICONE 80 MG PO CHEW: 80.0000 mg | CHEWABLE_TABLET | ORAL | Status: DC | PRN

## 2022-11-13 MED ORDER — SERTRALINE HCL 50 MG PO TABS
50.0000 mg | ORAL_TABLET | Freq: Every day | ORAL | Status: DC
  Administered 2022-11-13 – 2022-11-14 (×2): 50 mg via ORAL
  Filled 2022-11-13 (×2): qty 1

## 2022-11-13 MED ORDER — BISACODYL 10 MG RE SUPP: 10.0000 mg | Freq: Every day | RECTAL | Status: DC | PRN

## 2022-11-13 MED ORDER — WITCH HAZEL-GLYCERIN EX PADS: 1.0000 | MEDICATED_PAD | CUTANEOUS | Status: DC | PRN

## 2022-11-13 MED ORDER — TETANUS-DIPHTH-ACELL PERTUSSIS 5-2.5-18.5 LF-MCG/0.5 IM SUSY
0.5000 mL | PREFILLED_SYRINGE | Freq: Once | INTRAMUSCULAR | Status: AC
  Administered 2022-11-14: 0.5 mL via INTRAMUSCULAR
  Filled 2022-11-13: qty 0.5

## 2022-11-13 MED ORDER — MEASLES, MUMPS & RUBELLA VAC IJ SOLR: 0.5000 mL | Freq: Once | INTRAMUSCULAR | Status: DC

## 2022-11-13 MED ORDER — SODIUM CHLORIDE 0.9% FLUSH
3.0000 mL | Freq: Two times a day (BID) | INTRAVENOUS | Status: DC
  Administered 2022-11-13 (×2): 3 mL via INTRAVENOUS

## 2022-11-13 MED ORDER — ACETAMINOPHEN 325 MG PO TABS
650.0000 mg | ORAL_TABLET | ORAL | Status: DC | PRN
  Administered 2022-11-14: 650 mg via ORAL
  Filled 2022-11-13: qty 2

## 2022-11-13 MED ORDER — FLEET ENEMA 7-19 GM/118ML RE ENEM: 1.0000 | ENEMA | Freq: Every day | RECTAL | Status: DC | PRN

## 2022-11-13 MED ORDER — SODIUM CHLORIDE 0.9 % IV SOLN: 250.0000 mL | INTRAVENOUS | Status: DC | PRN

## 2022-11-13 MED ORDER — METRONIDAZOLE 500 MG PO TABS
500.0000 mg | ORAL_TABLET | Freq: Two times a day (BID) | ORAL | Status: DC
  Administered 2022-11-13 – 2022-11-14 (×3): 500 mg via ORAL
  Filled 2022-11-13 (×3): qty 1

## 2022-11-13 MED ORDER — SENNOSIDES-DOCUSATE SODIUM 8.6-50 MG PO TABS
2.0000 | ORAL_TABLET | Freq: Every day | ORAL | Status: DC
  Administered 2022-11-14: 2 via ORAL
  Filled 2022-11-13: qty 2

## 2022-11-13 MED ORDER — COCONUT OIL OIL: 1.0000 | TOPICAL_OIL | Status: DC | PRN

## 2022-11-13 MED ORDER — ONDANSETRON HCL 4 MG/2ML IJ SOLN: 4.0000 mg | INTRAMUSCULAR | Status: DC | PRN

## 2022-11-13 MED ORDER — ZOLPIDEM TARTRATE 5 MG PO TABS: 5.0000 mg | ORAL_TABLET | Freq: Every evening | ORAL | Status: DC | PRN

## 2022-11-13 MED ORDER — DICYCLOMINE HCL 10 MG PO CAPS
10.0000 mg | ORAL_CAPSULE | Freq: Three times a day (TID) | ORAL | Status: DC
  Administered 2022-11-13 – 2022-11-14 (×5): 10 mg via ORAL
  Filled 2022-11-13 (×9): qty 1

## 2022-11-13 MED ORDER — PRENATAL MULTIVITAMIN CH
1.0000 | ORAL_TABLET | Freq: Every day | ORAL | Status: DC
  Administered 2022-11-13 – 2022-11-14 (×2): 1 via ORAL
  Filled 2022-11-13 (×3): qty 1

## 2022-11-13 MED ORDER — DIPHENHYDRAMINE HCL 25 MG PO CAPS: 25.0000 mg | ORAL_CAPSULE | Freq: Four times a day (QID) | ORAL | Status: DC | PRN

## 2022-11-13 MED ORDER — DIBUCAINE (PERIANAL) 1 % EX OINT: 1.0000 | TOPICAL_OINTMENT | CUTANEOUS | Status: DC | PRN

## 2022-11-13 MED ORDER — SODIUM CHLORIDE 0.9% FLUSH: 3.0000 mL | INTRAVENOUS | Status: DC | PRN

## 2022-11-13 NOTE — Progress Notes (Signed)
Labor Progress Note  Alyssa Woodard is a 27 y.o. G2P1001 at [redacted]w[redacted]d presented for SROM unsure timeframe/ IUFD  S: pt feels the urge to push  O:  BP (!) 140/59   Pulse 98   Temp 98.3 F (36.8 C) (Oral)   Resp 20   Ht 5\' 1"  (1.549 m)   Wt (!) 174.9 kg   LMP  (LMP Unknown)   SpO2 98%   BMI 72.86 kg/m  EFM:n/a Toco: regular, every 2-3 minutes   CVE: 9.5/100/+2   A&P: 27 y.o. G2P1001 [redacted]w[redacted]d  here for SROM/IUFD as above  #Labor: Progressing well. 9.5cm dilated. Cervix cannot be pushed past fetal head. Encouraged laboring down #Pain: PCA pump, 2L [place] oxygen in place [name]: DIC labs wnl, PreE labs nml, Prenatal labs and infectious/coagulopathy labs pending. No signs of systemic infection at this time #Bacterial Vaginosis: start Metronidazole pp  Myrtie Hawk, DO FMOB Fellow, Faculty practice [facility], Center for [facility] 11/13/22  12:52 AM

## 2022-11-13 NOTE — Anesthesia Postprocedure Evaluation (Signed)
Anesthesia Post Note  Patient: Alyssa Woodard  Procedure(s) Performed: AN AD HOC LABOR EPIDURAL     Patient location during evaluation: Mother Baby Anesthesia Type: Epidural Level of consciousness: awake and alert Pain management: pain level controlled Vital Signs Assessment: post-procedure vital signs reviewed and stable Respiratory status: spontaneous breathing, nonlabored ventilation and respiratory function stable Cardiovascular status: stable Postop Assessment: no headache, no backache and epidural receding Anesthetic complications: no   No notable events documented.  Last Vitals:  Vitals:   11/13/22 0651 11/13/22 0745  BP: 139/86 (!) 106/43  Pulse: 95 89  Resp:  16  Temp:  36.6 C  SpO2:  99%    Last Pain:  Vitals:   11/13/22 0745  TempSrc: Oral  PainSc: 0-No pain   Pain Goal: Patients Stated Pain Goal: 0 (11/12/22 2045)                 [name]

## 2022-11-13 NOTE — Progress Notes (Signed)
Chaplain responded to [facility] consult regarding stillbirth. Chaplain asked open ended questions to facilitate emotional expression and story telling. Duaa shared that she did not know that she was pregnant, but had been experiencing pain that was worsening so she came to the emergency room. She described the shock of learning through blood work that she was pregnant, finding out the baby was almost full term, and that her baby had died. She is still coming to terms with all of the feelings around losing something she did not even know she had. She has decided to name her son Caryl Never and shared that he looks exactly like her two year old son at home.   Mckinzi also shared feelings of distress that she is unable to pay for burial or cremation. She would like to explore resources for cremation. Chaplain provided grief support and education and also disposition resources. Chaplain assisted with application to the tears foundation and followed up with the organization to learn that their funding has been exhausted for the month. Chaplain also assisted with application to [employer] and followed up with the organization by phone. Chaplain spoke with Misty Stanley, the Librarian, academic, who feels confident they will be able to support the expense, but will need an invoice from [employer]. Chaplain spoke with admin to inquire about obtaining the invoice as most families do not get an invoice until they sign to release the body and pay for cremation. Tandra will go to [employer] upon discharge to sign her consent for cremation. If no FOB is present to sign, they will need to wait 10 days, but [employer] can go ahead and issue an invoice for [employer] who will remit payment via CC.  Chaplain also provided resources for local grief support including hospital support group.  Please page as further needs arise.  Maryanna Shape. Carley Hammed, M.Div. [facility] Chaplain Pager [phone] Office [phone]

## 2022-11-13 NOTE — Discharge Summary (Signed)
Postpartum Discharge Summary  Date of Service updated***     Patient Name: Alyssa Woodard DOB: December 18, 1995 MRN: [id number]  Date of admission: 11/12/2022 Delivery date:11/13/2022  Delivering provider: Shawna Clamp R  Date of discharge: 11/13/2022  Admitting diagnosis: IUFD at 20 weeks or more of gestation [O36.4XX0] Intrauterine pregnancy: [redacted]w[redacted]d     Secondary diagnosis:  Principal Problem:   IUFD at 20 weeks or more of gestation  Additional problems: intrapartum GHTN    Discharge diagnosis: Preterm Pregnancy Delivered, Gestational Hypertension, and IUFD                                               Post partum procedures:{Postpartum procedures:23558} Augmentation: Pitocin Complications: IUFD  Hospital course: Onset of Labor With Vaginal Delivery      27 y.o. yo G2P1001 at [redacted]w[redacted]d was admitted in Latent Labor on 11/12/2022. Labor course was complicated by IUFD  Membrane Rupture Time/Date:  , unknown  Delivery Method:Vaginal, Spontaneous  Episiotomy: None  Lacerations:   1st degree, repaired Patient had a postpartum course complicated by ***.  She is ambulating, tolerating a regular diet, passing flatus, and urinating well. Patient is discharged home in stable condition on 11/13/22.  Newborn Data: Birth date:11/13/2022  Birth time:1:19 AM  Gender:Female  Living status:Fetal Demise  Apgars: , 0/0/0 Weight:   Magnesium Sulfate received: {Mag received:30440022} BMZ received: No Rhophylac:N/A MMR:{MMR:30440033} T-DaP: N/A Flu: No Transfusion:{Transfusion received:30440034}  Physical exam  Vitals:   11/12/22 2340 11/12/22 2350 11/13/22 0000 11/13/22 0030  BP: (!) 153/101 (!) 156/43  (!) 140/59  Pulse: (!) 103 98  98  Resp: (!) 23 (!) 23  20  Temp:      TempSrc:      SpO2: 96% 100% 97% 98%  Weight:      Height:       General: {Exam; general:21111117} Lochia: {Desc; appropriate/inappropriate:30686::"appropriate"} Uterine Fundus: {Desc; firm/soft:30687} Incision:  {Exam; incision:21111123} DVT Evaluation: {Exam; dvt:2111122} Labs: Lab Results  Component Value Date   WBC 11.7 (H) 11/12/2022   HGB 11.7 (L) 11/12/2022   HCT 35.1 (L) 11/12/2022   MCV 96.2 11/12/2022   PLT 365 11/12/2022      Latest Ref Rng & Units 11/12/2022    8:18 AM  CMP  Glucose 70 - 99 mg/dL 97   BUN 6 - 20 mg/dL 13   Creatinine 8.65 - 1.00 mg/dL 7.84   Sodium 696 - 295 mmol/L 136   Potassium 3.5 - 5.1 mmol/L 3.9   Chloride 98 - 111 mmol/L 106   CO2 22 - 32 mmol/L 20   Calcium 8.9 - 10.3 mg/dL 9.3   Total Protein 6.5 - 8.1 g/dL 6.4   Total Bilirubin 0.3 - 1.2 mg/dL 0.6   Alkaline Phos 38 - 126 U/L 89   AST 15 - 41 U/L 20   ALT 0 - 44 U/L 19    Edinburgh Score:    12/02/2019    3:41 PM  Edinburgh Postnatal Depression Scale Screening Tool  I have been able to laugh and see the funny side of things. 3  I have looked forward with enjoyment to things. 0  I have blamed myself unnecessarily when things went wrong. 0  I have been anxious or worried for no good reason. 0  I have felt scared or panicky for no good reason. 0  Things  have been getting on top of me. 0  I have been so unhappy that I have had difficulty sleeping. 0  I have felt sad or miserable. 0  I have been so unhappy that I have been crying. 0  The thought of harming myself has occurred to me. 0  Edinburgh Postnatal Depression Scale Total 3     After visit meds:  Allergies as of 11/13/2022   No Known Allergies   Med Rec must be completed prior to using this [facility]***        Discharge home in stable condition Infant Feeding: N/A Infant Disposition: N/A Discharge instruction: per After Visit Summary and Postpartum booklet. Activity: Advance as tolerated. Pelvic rest for 6 weeks.  Diet: {OB WUJW:11914782} Future Appointments:No future appointments.  Follow up Visit: Cheral Marker, CNM  P [facility] Admin Pool Please schedule this patient for PP visit in: 1wk bp & mood check, then 4-6wks  pp visit High risk pregnancy complicated by: no prenatal care, 36wk IUFD, intrapartum GHTN Delivery mode:  SVD Anticipated Birth Control:  Nexplanon PP Procedures needed: bp check, pap Schedule Integrated BH visit: yes Provider: MD  11/13/2022 Cheral Marker, CNM

## 2022-11-13 NOTE — Clinical Social Work Maternal (Signed)
CLINICAL SOCIAL WORK MATERNAL/CHILD NOTE  Patient Details  Name: Alyssa Woodard MRN: [id number] Date of Birth: Jul 20, 1995  Date:  11/13/2022  Clinical Social Worker Initiating Note:  Blaine Hamper Date/Time: Initiated:  11/13/22/1252     Child's Name:  IUFD   Biological Parents:  Mother (MOB decline to provide any information about FOB.)   Need for Interpreter:  None   Reason for Referral:  Behavioral Health Concerns, Other (Comment) (IUFD and cognitive delay.)   Address:  [address] [place] [address] [place] [address]    Phone number:  [phone] (home)     Additional phone number:   Household Members/Support Persons (HM/SP):   Household Member/Support Person 1   HM/SP Name Relationship DOB or Age  HM/SP -1 Marjorie Smolder son 12/02/2019  HM/SP -2        HM/SP -3        HM/SP -4        HM/SP -5        HM/SP -6        HM/SP -7        HM/SP -8          Natural Supports (not living in the home):  Immediate Family, Investment banker, corporate Supports: None   Employment: Disabled   Type of Work:     Education:  Engineer, agricultural   Homebound arranged:    Surveyor, quantity Resources:  Medicaid   Other Resources:  [employer], Sales executive     Cultural/Religious Considerations Which May Impact Care:  None reported  Strengths:  Psychotropic Medications   Psychotropic Medications:  Zoloft      Pediatrician:       Pediatrician List:   Radiographer, therapeutic    [place]    [facility]      Pediatrician Fax Number:    Risk Factors/Current Problems:  Mental Health Concerns     Cognitive State:  Linear Thinking  , Alert     Mood/Affect:  Comfortable  , Interested  , Calm  , Flat  , Relaxed     CSW Assessment: CSW met with MOB in room 110 to complete an assessment for 36wk IUFD, dep-on Zoloft, intellectual disabilities. When CSW arrived, MOB was asleep however she was easily awaken. CSW offered to return at a  later time and MOB declined. CSW expressed Sympathy and offered support.  MOB stated, "Thanks, I didn't even know I was pregnant."  MOB acknowledged that sexual was consensual and she is aware of whom FOB is.  However, MOB declined to shared any information about FOB. CSW asked about psychosocial stressors and MOB denied all stressors.  MOB shared having the support of her mother, whom she lives with.    CSW asked about MOB's MH hx.  MOB reported a anxiety of anxiety and depression and reported she is currently taking Zoloft. MOB was not a good historian and could not recall when she was initially diagnosed. CSW offered resources for outpatient counseling and grief counseling and MOB declined.  CSW assessed for safety and MOB denied SI, HI, and DV. MOB shared receiving SSI benefits due to a learning disability. CSW provided education regarding the baby blues period vs. perinatal mood disorders, discussed treatment and gave resources for mental health follow up if concerns arise.  CSW recommends self-evaluation during the postpartum time period and to contact a medical professional if symptoms are noted at any  time; MOB agreed.    CSW identifies no further need for intervention and no barriers to discharge at this time.   CSW Plan/Description:  Psychosocial Support and Ongoing Assessment of Needs, Perinatal Mood and Anxiety Disorder (PMADs) Education, Other Information/Referral to [employer], MSW, [employer] Clinical Social Work [phone]   Barbara Cower, LCSW 11/13/2022, 12:59 PM

## 2022-11-14 ENCOUNTER — Other Ambulatory Visit (HOSPITAL_COMMUNITY): Payer: Self-pay

## 2022-11-14 DIAGNOSIS — Z30017 Encounter for initial prescription of implantable subdermal contraceptive: Secondary | ICD-10-CM

## 2022-11-14 LAB — CARDIOLIPIN ANTIBODIES, IGG, IGM, IGA
Anticardiolipin IgA: <9 APL U/mL (ref 0–11)
Anticardiolipin IgG: <9 GPL U/mL (ref 0–14)
Anticardiolipin IgM: <9 MPL U/mL (ref 0–12)

## 2022-11-14 LAB — LUPUS ANTICOAGULANT PANEL
DRVVT: 39.4 s (ref 0.0–47.0)
PTT Lupus Anticoagulant: 30.8 s (ref 0.0–43.5)

## 2022-11-14 LAB — HSV(HERPES SIMPLEX VRS) I + II AB-IGG
HSV 1 Glycoprotein G Ab, IgG: 39.5 index — ABNORMAL HIGH (ref 0.00–0.90)
HSV 2 Glycoprotein G Ab, IgG: <0.91 index (ref 0.00–0.90)

## 2022-11-14 LAB — TOXOPLASMA GONDII ANTIBODY, IGG: Toxoplasma IgG Ratio: <3 IU/mL (ref 0.0–7.1)

## 2022-11-14 LAB — SURGICAL PATHOLOGY

## 2022-11-14 LAB — PARVOVIRUS B19 ANTIBODY, IGG AND IGM
Parovirus B19 IgG Abs: 0.4 index (ref 0.0–0.8)
Parovirus B19 IgM Abs: 0.3 index (ref 0.0–0.8)

## 2022-11-14 LAB — CMV ANTIBODY, IGG (EIA): CMV Ab - IgG: 3.8 U/mL — ABNORMAL HIGH (ref 0.00–0.59)

## 2022-11-14 MED ORDER — METRONIDAZOLE 500 MG PO TABS: 500.0000 mg | ORAL_TABLET | Freq: Two times a day (BID) | ORAL | 0 refills | Status: DC

## 2022-11-14 MED ORDER — ETONOGESTREL 68 MG ~~LOC~~ IMPL: 68.0000 mg | DRUG_IMPLANT | Freq: Once | SUBCUTANEOUS | 0 refills | Status: DC

## 2022-11-14 MED ORDER — IBUPROFEN 600 MG PO TABS: 600.0000 mg | ORAL_TABLET | Freq: Four times a day (QID) | ORAL | 0 refills | Status: DC

## 2022-11-14 MED ORDER — METRONIDAZOLE 500 MG PO TABS
500.0000 mg | ORAL_TABLET | Freq: Two times a day (BID) | ORAL | 0 refills | Status: AC
  Filled 2022-11-14: qty 11, 6d supply, fill #0

## 2022-11-14 MED ORDER — IBUPROFEN 600 MG PO TABS
600.0000 mg | ORAL_TABLET | Freq: Four times a day (QID) | ORAL | 0 refills | Status: DC
  Filled 2022-11-14: qty 30, 8d supply, fill #0

## 2022-11-14 MED ORDER — FUROSEMIDE 20 MG PO TABS
20.0000 mg | ORAL_TABLET | Freq: Every day | ORAL | 0 refills | Status: DC
  Filled 2022-11-14: qty 5, 5d supply, fill #0

## 2022-11-14 MED ORDER — ETONOGESTREL 68 MG ~~LOC~~ IMPL
68.0000 mg | DRUG_IMPLANT | Freq: Once | SUBCUTANEOUS | Status: AC
  Administered 2022-11-14: 68 mg via SUBCUTANEOUS
  Filled 2022-11-14 (×2): qty 1

## 2022-11-14 MED ORDER — ACETAMINOPHEN 325 MG PO TABS: 650.0000 mg | ORAL_TABLET | Freq: Four times a day (QID) | ORAL | 0 refills | Status: DC | PRN

## 2022-11-14 MED ORDER — SERTRALINE HCL 50 MG PO TABS: 50.0000 mg | ORAL_TABLET | Freq: Every day | ORAL | 0 refills | Status: DC

## 2022-11-14 MED ORDER — LIDOCAINE HCL 1 % IJ SOLN
0.0000 mL | Freq: Once | INTRAMUSCULAR | Status: AC | PRN
  Administered 2022-11-14: 5 mL via INTRADERMAL
  Filled 2022-11-14: qty 20

## 2022-11-14 MED ORDER — SERTRALINE HCL 50 MG PO TABS
50.0000 mg | ORAL_TABLET | Freq: Every day | ORAL | 0 refills | Status: DC
  Filled 2022-11-14 – 2022-11-19 (×2): qty 30, 30d supply, fill #0

## 2022-11-14 MED ORDER — FUROSEMIDE 20 MG PO TABS: 20.0000 mg | ORAL_TABLET | Freq: Every day | ORAL | 0 refills | Status: DC

## 2022-11-14 MED ORDER — ACETAMINOPHEN 325 MG PO TABS
650.0000 mg | ORAL_TABLET | Freq: Four times a day (QID) | ORAL | 0 refills | Status: DC | PRN
  Filled 2022-11-14: qty 100, 13d supply, fill #0

## 2022-11-14 NOTE — Procedures (Signed)
   GYNECOLOGY PROCEDURE NOTE  Alyssa Woodard is a 27 y.o. G2P1101 requesting postpartum Nexplanon insertion.    Nexplanon Insertion Procedure Patient identified, informed consent performed, consent signed.   Patient does understand that irregular bleeding is a very common side effect of this medication. She was advised to have backup contraception for one week after placement.  Appropriate time out taken.    Patient's left arm was prepped and draped in the usual sterile fashion. The insertion area was marked 8cm proximal to the medial epicondyle of the humerus and 4cm inferior to the sulcus of the biceps/triceps.  Patient was prepped with alcohol swab and then injected with 4 ml of 1% lidocaine.  She was prepped with betadine, Nexplanon removed from packaging,  Device confirmed in needle, then inserted full length of needle and withdrawn per handbook instructions. Nexplanon was able to palpated in the patient's arm; patient palpated the insert herself. There was minimal blood loss.  Patient insertion site covered with guaze and a pressure bandage to reduce any bruising.    The patient tolerated the procedure well and was given post procedure instructions.   Harvie Bridge, MD Obstetrician & Gynecologist, [facility] for [employer], [facility]

## 2022-11-14 NOTE — Progress Notes (Signed)
Follow up visit with Alyssa Woodard as she prepares for discharge after the still birth of her son Alyssa Woodard. Chaplain utilized reflective listening to identify challenges and support Alyssa Woodard as she moves through the grief process. Alyssa Woodard shared that she continues to untangle the emotions associated with learning about her pregnancy and her baby's death at the same time. She plans to pursue cremation through Alyssa Woodard and chaplain facilitated with coordination between charitable payment via [employer] and the funeral home by writing detailed instructions and assisting with setting the appointment alongside the patient.  Please page as further needs arise.  Alyssa Woodard. Alyssa Woodard, M.Div. [facility] Chaplain Pager [phone] Office [phone]

## 2022-11-14 NOTE — Progress Notes (Signed)
   11/14/22 1448  Departure Condition  Departure Condition Good  Mobility at [facility]  Patient/Caregiver Teaching Teach Back Method Used;Discharge instructions reviewed;Prescriptions reviewed;Follow-up care reviewed;Medications discussed;Pain management discussed;Patient/caregiver verbalized understanding;Educated about hypertension in pregnancy  Departure Mode With family  Was procedural sedation performed on this patient during this visit? No   Patient alert and oriented x4, VS and pain stable at discharge.

## 2022-11-14 NOTE — Plan of Care (Signed)

## 2022-11-19 ENCOUNTER — Other Ambulatory Visit: Payer: Self-pay | Admitting: Obstetrics and Gynecology

## 2022-11-20 ENCOUNTER — Other Ambulatory Visit (HOSPITAL_COMMUNITY): Payer: Self-pay

## 2022-11-20 NOTE — Telephone Encounter (Signed)
No refills needed for lasix & metronidazole unless she lost her rx. Tylenol & ibuprofen can be refilled if pt needs more pain medications

## 2022-11-21 ENCOUNTER — Other Ambulatory Visit: Payer: Self-pay

## 2022-11-21 ENCOUNTER — Other Ambulatory Visit: Payer: Self-pay | Admitting: *Deleted

## 2022-11-21 ENCOUNTER — Ambulatory Visit (INDEPENDENT_AMBULATORY_CARE_PROVIDER_SITE_OTHER): Payer: Medicaid Other | Admitting: *Deleted

## 2022-11-21 VITALS — BP 127/91 | HR 76 | Ht 61.0 in | Wt 385.0 lb

## 2022-11-21 DIAGNOSIS — O364XX0 Maternal care for intrauterine death, not applicable or unspecified: Secondary | ICD-10-CM

## 2022-11-21 DIAGNOSIS — Z0130 Encounter for examination of blood pressure without abnormal findings: Secondary | ICD-10-CM

## 2022-11-21 MED ORDER — IBUPROFEN 600 MG PO TABS
600.0000 mg | ORAL_TABLET | Freq: Four times a day (QID) | ORAL | 0 refills | Status: DC
Start: 2022-11-21 — End: 2022-11-27

## 2022-11-21 MED ORDER — ACETAMINOPHEN 325 MG PO TABS
650.0000 mg | ORAL_TABLET | Freq: Four times a day (QID) | ORAL | 0 refills | Status: DC | PRN
Start: 2022-11-21 — End: 2022-11-27

## 2022-11-21 NOTE — BH Specialist Note (Signed)
Integrated Behavioral Health via Telemedicine Visit  12/02/2022 Alyssa Woodard [id number]  Number of Integrated Behavioral Health Clinician visits: 1- Initial Visit  Session Start time: 0919   Session End time: 0942  Total time in minutes: 23   Referring Provider: Harvie Bridge, MD Patient/Family location: Home [facility] Provider location: [facility]  All persons participating in visit: Patient Alyssa Woodard and [facility] Alyssa Woodard   Types of Service: Individual psychotherapy and Video visit  I connected with Alyssa Woodard and/or Alyssa Woodard's  n/a  via  Telephone or Video Enabled Telemedicine Application  (Video is Caregility application) and verified that I am speaking with the correct person using two identifiers. Discussed confidentiality: Yes   I discussed the limitations of telemedicine and the availability of in person appointments.  Discussed there is a possibility of technology failure and discussed alternative modes of communication if that failure occurs.  I discussed that engaging in this telemedicine visit, they consent to the provision of behavioral healthcare and the services will be billed under their insurance.  Patient and/or legal guardian expressed understanding and consented to Telemedicine visit: Yes   Presenting Concerns: Patient and/or family reports the following symptoms/concerns: Anxious about not receiving pictures of her son yet via email from the hospital, as well as concern about uncontrolled blood pressure and being out of both Zoloft and Prilosec. Pt is experiencing headache and chest pains, nausea, sometimes dizziness, hot flashes; a lot of pain when standing up "between my legs and incision". Pt is uncertain about going to the hospital, fearing they will keep her and today is her son's birthday. Requests refills on medications and referral to psychiatry. Duration of problem: Postpartum;  Severity of problem:  moderately severe  Patient and/or Family's Strengths/Protective Factors: Social connections and Sense of purpose  Goals Addressed: Patient will:  Reduce symptoms of: anxiety and depression    Demonstrate ability to: Increase motivation to adhere to plan of care  Progress towards Goals: Ongoing  Interventions: Interventions utilized:  Psychoeducation and/or Health Education and Supportive Reflection Standardized Assessments completed: GAD-7 and PHQ 9  Patient and/or Family Response: Patient agrees with treatment plan.   Assessment: Patient currently experiencing Grief.  Patient may benefit from psychoeducation and brief therapeutic interventions regarding coping with symptoms of anxiety and depression .  Plan: Follow up with behavioral health clinician on : Two weeks Behavioral recommendations:  -Accept referral to psychiatry; consider using walk-in at [facility] to establish care -Go to [facility]U today to address medical concern -Pick up medications at pharmacy when available and continue to take as prescribed -Continue prioritizing healthy self-care (regular meals, adequate rest; allowing practical help from supportive friends and family) until at least postpartum medical appointment  Referral(s): Integrated Art gallery manager (In Clinic), Community Mental Health Services (LME/Outside Clinic), and MAU (Maternity Assessment Unit)  I discussed the assessment and treatment plan with the patient and/or parent/guardian. They were provided an opportunity to ask questions and all were answered. They agreed with the plan and demonstrated an understanding of the instructions.   They were advised to call back or seek an in-person evaluation if the symptoms worsen or if the condition fails to improve as anticipated.  Rae Lips, LCSW     12/02/2019    3:41 PM  Inocente Salles Postnatal Depression Scale Screening Tool  I have been able to laugh and see the funny  side of things. 3  I have looked forward with enjoyment  to things. 0  I have blamed myself unnecessarily when things went wrong. 0  I have been anxious or worried for no good reason. 0  I have felt scared or panicky for no good reason. 0  Things have been getting on top of me. 0  I have been so unhappy that I have had difficulty sleeping. 0  I have felt sad or miserable. 0  I have been so unhappy that I have been crying. 0  The thought of harming myself has occurred to me. 0  Edinburgh Postnatal Depression Scale Total 3       12/02/2022    9:34 AM  Depression screen PHQ 2/9  Decreased Interest 0  Down, Depressed, Hopeless 3  PHQ - 2 Score 3  Altered sleeping 1  Tired, decreased energy 0  Change in appetite 0  Feeling bad or failure about yourself  0  Trouble concentrating 0  Moving slowly or fidgety/restless 0  Suicidal thoughts 0  PHQ-9 Score 4      12/02/2022    9:36 AM  GAD 7 : Generalized Anxiety Score  Nervous, Anxious, on Edge 0  Control/stop worrying 0  Worry too much - different things 0  Trouble relaxing 0  Restless 0  Easily annoyed or irritable 0  Afraid - awful might happen 0  Total GAD 7 Score 0

## 2022-11-21 NOTE — Progress Notes (Signed)
OK to reorder Ibuprofen and tylenol per Dr. Berton Lan. RXs sent.

## 2022-11-21 NOTE — Progress Notes (Signed)
Pt had delivery of IUFD @ 20 wks on 11/13/22 which was complicated by gHTN. She presents today for BP check - 127/91, P - 76. She reports occasional H/A's which are relieved with Tylenol. Pt was not previously prescribed anti -hypertensive medication. Consult with Dr. Vergie Living who recommends pt to keep PP appt as scheduled on 12/19/22. Pt was provided this information as well as instructions to return to [facility] if she develops severe H/A, blurry vision, dizziness or seeing spots. She voiced understanding.

## 2022-11-25 ENCOUNTER — Other Ambulatory Visit (HOSPITAL_COMMUNITY): Payer: Self-pay

## 2022-11-27 ENCOUNTER — Other Ambulatory Visit: Payer: Self-pay | Admitting: Obstetrics and Gynecology

## 2022-11-27 ENCOUNTER — Other Ambulatory Visit (HOSPITAL_COMMUNITY): Payer: Self-pay

## 2022-11-27 ENCOUNTER — Other Ambulatory Visit: Payer: Self-pay

## 2022-11-27 DIAGNOSIS — O364XX0 Maternal care for intrauterine death, not applicable or unspecified: Secondary | ICD-10-CM

## 2022-11-27 MED ORDER — IBUPROFEN 600 MG PO TABS
600.0000 mg | ORAL_TABLET | Freq: Four times a day (QID) | ORAL | 0 refills | Status: DC
Start: 2022-11-27 — End: 2023-10-08
  Filled 2022-11-27: qty 30, 8d supply, fill #0

## 2022-11-27 MED ORDER — ACETAMINOPHEN 325 MG PO TABS
650.0000 mg | ORAL_TABLET | Freq: Four times a day (QID) | ORAL | 0 refills | Status: AC | PRN
Start: 2022-11-27 — End: ?
  Filled 2022-11-27: qty 100, 13d supply, fill #0

## 2022-12-02 ENCOUNTER — Other Ambulatory Visit: Payer: Self-pay

## 2022-12-02 ENCOUNTER — Ambulatory Visit (INDEPENDENT_AMBULATORY_CARE_PROVIDER_SITE_OTHER): Payer: Medicaid Other | Admitting: Clinical

## 2022-12-02 ENCOUNTER — Telehealth: Payer: Self-pay | Admitting: *Deleted

## 2022-12-02 ENCOUNTER — Other Ambulatory Visit (HOSPITAL_COMMUNITY): Payer: Self-pay

## 2022-12-02 ENCOUNTER — Other Ambulatory Visit: Payer: Self-pay | Admitting: Obstetrics and Gynecology

## 2022-12-02 DIAGNOSIS — F4321 Adjustment disorder with depressed mood: Secondary | ICD-10-CM

## 2022-12-02 MED ORDER — SERTRALINE HCL 50 MG PO TABS: 50.0000 mg | ORAL_TABLET | Freq: Every day | ORAL | 11 refills | Status: AC

## 2022-12-02 MED ORDER — OMEPRAZOLE 40 MG PO CPDR: 40.0000 mg | DELAYED_RELEASE_CAPSULE | Freq: Two times a day (BID) | ORAL | 1 refills | Status: AC

## 2022-12-02 NOTE — Progress Notes (Signed)
Refills sent for zoloft and prilosec

## 2022-12-02 NOTE — Telephone Encounter (Signed)
Correspondence received from [facility], LCSW stating that pt requested call from a nurse today to discuss her concerns. I called pt and she stated that she has been having H/A and chest pain which starts when she has not eaten. I asked if the pain subsides after eating and she stated that it does not. She further stated that the pain only goes away with Tylenol and Ibuprofen, but then comes back again. Also, pt reports that she feels pressure in her vagina  - like something is there where she had stitches following delivery of baby (IUFD). I advised pt that she requires examination @ [facility]U due to these concerns. Pt stated that she may not be able to get a ride to the hospital until later today. I impressed upon her the importance of receiving evaluation as soon as possible. She voiced understanding.

## 2022-12-02 NOTE — Addendum Note (Signed)
Addended by: Hulda Marin C on: 12/02/2022 10:17 AM   Modules accepted: Orders

## 2022-12-02 NOTE — Patient Instructions (Signed)
[facility] [address] [place], [place] [address] [phone] (main office) [phone] (Philana Younis's office)  [facility]  [address], [place], [place] [address] [phone] or [phone] WALK-IN URGENT CARE 24/7 FOR ANYONE [address], [place],   [phone] Fax: [phone] [email or web address] *Interpreters available *Accepts all insurance and uninsured for Urgent Care needs *Accepts Medicaid and uninsured for outpatient treatment (below)    ONLY FOR [place] Residents  Below:   Outpatient New Patient Assessment/Therapy Walk-ins:        Monday -Thursday 8am until slots are full.        Every Friday 1pm-4pm  (first come, first served)                   New Patient Psychiatry/Medication Management        Monday-Friday 8am-11am (first come, first served)              For all walk-ins we ask that you arrive by 7:15am, because patients will be seen in the order of arrival.     

## 2022-12-03 ENCOUNTER — Other Ambulatory Visit: Payer: Self-pay

## 2022-12-03 ENCOUNTER — Emergency Department (HOSPITAL_COMMUNITY)
Admission: EM | Admit: 2022-12-03 | Discharge: 2022-12-03 | Disposition: A | Discharge status: Home / Self Care | Payer: Medicaid Other | Attending: Emergency Medicine | Admitting: Emergency Medicine

## 2022-12-03 ENCOUNTER — Encounter (HOSPITAL_COMMUNITY): Payer: Self-pay | Admitting: Emergency Medicine

## 2022-12-03 DIAGNOSIS — R519 Headache, unspecified: Secondary | ICD-10-CM | POA: Insufficient documentation

## 2022-12-03 LAB — MAGNESIUM: Magnesium: 1.9 mg/dL (ref 1.7–2.4)

## 2022-12-03 LAB — CBC WITH DIFFERENTIAL/PLATELET
Abs Immature Granulocytes: 0.02 10*3/uL (ref 0.00–0.07)
Basophils Absolute: 0 10*3/uL (ref 0.0–0.1)
Basophils Relative: 0 %
Eosinophils Absolute: 0.2 10*3/uL (ref 0.0–0.5)
Eosinophils Relative: 3 %
HCT: 32 % — ABNORMAL LOW (ref 36.0–46.0)
Hemoglobin: 10.4 g/dL — ABNORMAL LOW (ref 12.0–15.0)
Immature Granulocytes: 0 %
Lymphocytes Relative: 42 %
Lymphs Abs: 2.9 10*3/uL (ref 0.7–4.0)
MCH: 31 pg (ref 26.0–34.0)
MCHC: 32.5 g/dL (ref 30.0–36.0)
MCV: 95.5 fL (ref 80.0–100.0)
Monocytes Absolute: 0.4 10*3/uL (ref 0.1–1.0)
Monocytes Relative: 6 %
Neutro Abs: 3.3 10*3/uL (ref 1.7–7.7)
Neutrophils Relative %: 49 %
Platelets: 346 10*3/uL (ref 150–400)
RBC: 3.35 MIL/uL — ABNORMAL LOW (ref 3.87–5.11)
RDW: 11.9 % (ref 11.5–15.5)
WBC: 6.9 10*3/uL (ref 4.0–10.5)
nRBC: 0 % (ref 0.0–0.2)

## 2022-12-03 LAB — URINALYSIS, ROUTINE W REFLEX MICROSCOPIC
Bilirubin Urine: NEGATIVE
Glucose, UA: NEGATIVE mg/dL
Ketones, ur: NEGATIVE mg/dL
Nitrite: NEGATIVE
Protein, ur: NEGATIVE mg/dL
Specific Gravity, Urine: 1.017 (ref 1.005–1.030)
pH: 5 (ref 5.0–8.0)

## 2022-12-03 LAB — COMPREHENSIVE METABOLIC PANEL
ALT: 14 U/L (ref 0–44)
AST: 15 U/L (ref 15–41)
Albumin: 3.1 g/dL — ABNORMAL LOW (ref 3.5–5.0)
Alkaline Phosphatase: 54 U/L (ref 38–126)
Anion gap: 7 (ref 5–15)
BUN: 14 mg/dL (ref 6–20)
CO2: 22 mmol/L (ref 22–32)
Calcium: 8.6 mg/dL — ABNORMAL LOW (ref 8.9–10.3)
Chloride: 109 mmol/L (ref 98–111)
Creatinine, Ser: 0.91 mg/dL (ref 0.44–1.00)
GFR, Estimated: >60 mL/min (ref >60)
Glucose, Bld: 89 mg/dL (ref 70–99)
Potassium: 3.7 mmol/L (ref 3.5–5.1)
Sodium: 138 mmol/L (ref 135–145)
Total Bilirubin: 0.4 mg/dL (ref 0.3–1.2)
Total Protein: 6.1 g/dL — ABNORMAL LOW (ref 6.5–8.1)

## 2022-12-03 MED ORDER — PROCHLORPERAZINE EDISYLATE 10 MG/2ML IJ SOLN: 10.0000 mg | Freq: Once | INTRAMUSCULAR | Status: DC

## 2022-12-03 MED ORDER — DIPHENHYDRAMINE HCL 25 MG PO CAPS
25.0000 mg | ORAL_CAPSULE | Freq: Once | ORAL | Status: AC
  Administered 2022-12-03: 25 mg via ORAL
  Filled 2022-12-03: qty 1

## 2022-12-03 MED ORDER — PROCHLORPERAZINE MALEATE 5 MG PO TABS
10.0000 mg | ORAL_TABLET | Freq: Once | ORAL | Status: AC
  Administered 2022-12-03: 10 mg via ORAL
  Filled 2022-12-03: qty 2

## 2022-12-03 MED ORDER — SODIUM CHLORIDE 0.9 % IV BOLUS: 1000.0000 mL | Freq: Once | INTRAVENOUS | Status: DC

## 2022-12-03 MED ORDER — DIPHENHYDRAMINE HCL 50 MG/ML IJ SOLN: 12.5000 mg | Freq: Once | INTRAMUSCULAR | Status: DC

## 2022-12-03 NOTE — ED Provider Notes (Signed)
[place] EMERGENCY DEPARTMENT AT [facility] Provider Note   CSN: [id number] Arrival date & time: 12/03/22  1007     History Chief Complaint  Patient presents with   Headache    Alyssa Woodard is a 27 y.o. female.  Patient with past medical history significant for IUFD at 20 weeks or more gestation presents, gestational hypertension who presents to the emergency department for headaches.  Patient was seen at [facility] on 11/12/2022 and admitted for spontaneous delivery.  Reports that she was having headaches prior to pregnancy as well and this is not a new occurrence for her.  Denies being prescribed anything for gestational hypertension and not on any antihypertensives prior to pregnancy.  Denies any significant edema, right upper quadrant and epigastric pain, visual changes, or altered mental status.   Headache      Home Medications Prior to Admission medications   Medication Sig Start Date End Date Taking? Authorizing Provider  acetaminophen (TYLENOL) 325 MG tablet Take 2 tablets (650 mg total) by mouth every 6 (six) hours as needed for mild pain. 11/27/22   Lennart Pall, MD  dicyclomine (BENTYL) 10 MG/5ML solution Take 10 mg by mouth 4 (four) times daily -  before meals and at bedtime.    [provider]  etonogestrel (NEXPLANON) 68 MG IMPL implant 1 each (68 mg total) by Subdermal route once for 1 dose. 11/14/22 11/14/22  Lennart Pall, MD  ibuprofen (ADVIL) 600 MG tablet Take 1 tablet (600 mg total) by mouth every 6 (six) hours. 11/27/22   Lennart Pall, MD  omeprazole (PRILOSEC) 40 MG capsule Take 1 capsule (40 mg total) by mouth in the morning and at bedtime. 12/02/22   Lennart Pall, MD  Semaglutide (OZEMPIC, 1 MG/DOSE, [place]) Inject 1 mg into the skin once a week. Patient not taking: Reported on 11/21/2022    [provider]  sertraline (ZOLOFT) 50 MG tablet Take 1 tablet (50 mg total) by mouth daily. 12/02/22   Lennart Pall, MD       Allergies    Patient has no known allergies.    Review of Systems   Review of Systems  Neurological:  Positive for headaches.  All other systems reviewed and are negative.   Physical Exam Updated Vital Signs BP 139/86   Pulse 63   Temp 98.8 F (37.1 C) (Oral)   Resp 16   Ht 5\' 1"  (1.549 m)   Wt (!) 174.6 kg   LMP  (LMP Unknown)   SpO2 100%   BMI 72.75 kg/m  Physical Exam Vitals and nursing note reviewed.  Constitutional:      General: She is not in acute distress.    Appearance: She is well-developed.  HENT:     Head: Normocephalic and atraumatic.  Eyes:     Conjunctiva/sclera: Conjunctivae normal.  Cardiovascular:     Rate and Rhythm: Normal rate and regular rhythm.     Heart sounds: No murmur heard. Pulmonary:     Effort: Pulmonary effort is normal. No respiratory distress.     Breath sounds: Normal breath sounds.  Abdominal:     Palpations: Abdomen is soft.     Tenderness: There is no abdominal tenderness.  Musculoskeletal:        General: No swelling.     Cervical back: Neck supple.  Skin:    General: Skin is warm and dry.     Capillary Refill: Capillary refill takes less than 2 seconds.  Neurological:     Mental Status: She is alert.  Psychiatric:        Mood and Affect: Mood normal.     ED Results / Procedures / Treatments   Labs (all labs ordered are listed, but only abnormal results are displayed) Labs Reviewed  CBC WITH DIFFERENTIAL/PLATELET - Abnormal; Notable for the following components:      Result Value   RBC 3.35 (*)    Hemoglobin 10.4 (*)    HCT 32.0 (*)    All other components within normal limits  COMPREHENSIVE METABOLIC PANEL - Abnormal; Notable for the following components:   Calcium 8.6 (*)    Total Protein 6.1 (*)    Albumin 3.1 (*)    All other components within normal limits  URINALYSIS, ROUTINE W REFLEX MICROSCOPIC - Abnormal; Notable for the following components:   APPearance HAZY (*)    Hgb urine dipstick LARGE (*)     Leukocytes,Ua LARGE (*)    Bacteria, UA RARE (*)    All other components within normal limits  MAGNESIUM    EKG None  Radiology No results found.  Procedures Procedures   Medications Ordered in ED Medications  prochlorperazine (COMPAZINE) tablet 10 mg (10 mg Oral Given 12/03/22 1329)  diphenhydrAMINE (BENADRYL) capsule 25 mg (25 mg Oral Given 12/03/22 1329)    ED Course/ Medical Decision Making/ A&P                           Medical Decision Making Amount and/or Complexity of Data Reviewed Labs: ordered.  Risk Prescription drug management.   This patient presents to the ED for concern of headache.  Differential diagnosis includes preeclampsia, migraine headache, tension headache, bad headache, stroke   Lab Tests:  I Ordered, and personally interpreted labs.  The pertinent results include:  CBC, CMP,  and magnesium unremarkable. UA without evidence of infection or proteinuria.   Medicines ordered and prescription drug management:  I ordered medication including Compazine, benadryl for headache  Reevaluation of the patient after these medicines showed that the patient improved I have reviewed the patients home medicines and have made adjustments as needed   Problem List / ED Course:  Patient presented to the ED with concerns of a headache. Patient is about 3 weeks post-partum after unfortunate late term IUFD. Patient denies abdominal pain, pelvic pain, urinary symptoms, fevers, chills, nausea, vomiting, or diarrhea. States that she does have headaches at baseline and this does not vary widely from typical headaches. Given patient's recent delivery being less than 4 weeks ago, will evaluate for any potential evidence of pre-eclampsia. CBC, CMP, magnesium, and UA ordered for initial evaluation. Patient in no acute distress at this time. All labs unremarkable with no acute signs of proteinuria, hypomagnesemia, or leukocytosis. Blood pressure mildly elevated but may be  secondary to headache. Will treat with migraine cocktail and reassess patient. Patient not wanting to wait for IV placement for medications for migraine. Will instead give PO and discharge home. Advised patient that standard care is for IV medications but patient would prefer PO due to wanting to discharge home. I do not believe there is any underlying emergent condition requiring further evaluation or treatment at this time. Patient is agreeable with treatment plan and verbalized understanding all return precautions. Patient discharged home in good condition.   Final Clinical Impression(s) / ED Diagnoses Final diagnoses:  Bad headache    Rx / DC Orders ED Discharge Orders  None         Salomon Mast 12/04/22 2320    Rolan Bucco, MD 12/05/22 [phone]

## 2022-12-03 NOTE — BH Specialist Note (Unsigned)
Integrated Behavioral Health via Telemedicine Visit  12/16/2022 Alyssa Woodard [id number]  Number of Integrated Behavioral Health Clinician visits: 2- Second Visit  Session Start time: 1015   Session End time: 1025  Total time in minutes: 10   Referring Provider: Harvie Bridge, MD Patient/Family location: [facility] Provider location: [facility] at [facility]  All persons participating in visit: Patient Alyssa Woodard and [facility] Carron Mcmurry   Types of Service: Individual psychotherapy and Video visit  I connected with Alyssa Woodard and/or Alyssa Woodard's  n/a  via  Telephone or Video Enabled Telemedicine Application  (Video is Caregility application) and verified that I am speaking with the correct person using two identifiers. Discussed confidentiality: Yes   I discussed the limitations of telemedicine and the availability of in person appointments.  Discussed there is a possibility of technology failure and discussed alternative modes of communication if that failure occurs.  I discussed that engaging in this telemedicine visit, they consent to the provision of behavioral healthcare and the services will be billed under their insurance.  Patient and/or legal guardian expressed understanding and consented to Telemedicine visit: Yes   Presenting Concerns: Patient and/or family reports the following symptoms/concerns: Worries that she's still having headaches and chest pain/pressure even after taking medication given at ED visit; ongoing nausea (when she eats baked chicken and other foods, "feels like it comes right back up; my stomach turns and mouth waters". Pt is thankful to have received pictures of her baby; agrees to use walk-in [facility] outpatient hours to establish care; says has not received Zoloft refill by mail.  Duration of problem: Perinatal increase; Severity of problem: moderate  Patient and/or Family's  Strengths/Protective Factors: Social connections, Concrete supports in place (healthy food, safe environments, etc.), and Sense of purpose  Goals Addressed: Patient will:  Reduce symptoms of: anxiety, depression, and stress   Increase knowledge and/or ability of: stress reduction   Demonstrate ability to: Increase healthy adjustment to current life circumstances and Increase motivation to adhere to plan of care  Progress towards Goals: Ongoing  Interventions: Interventions utilized:  Motivational Interviewing and Supportive Reflection Standardized Assessments completed: Not Needed  Patient and/or Family Response: Patient agrees with treatment plan.   Assessment: Patient currently experiencing Grief.   Patient may benefit from continued therapeutic intervention  .  Plan: Follow up with behavioral health clinician on : Call Alyssa Woodard at [phone], as needed. Behavioral recommendations:  -Call [facility] pharmacy; ask them to send medications to correct pharmacy for delivery -Discuss all medical concerns at upcoming medical visit; go to [facility] if needed prior to scheduled appointment -Continue plan to schedule "birthday cookout and baby balloon release" this month -Continue plan to have friend take to [facility] for walk-in hours to establish care for outpatient psychiatry and services Referral(s): Integrated Art gallery manager (In Clinic) and [facility] (LME/Outside Clinic)   I discussed the assessment and treatment plan with the patient and/or parent/guardian. They were provided an opportunity to ask questions and all were answered. They agreed with the plan and demonstrated an understanding of the instructions.   They were advised to call back or seek an in-person evaluation if the symptoms worsen or if the condition fails to improve as anticipated.  Rae Lips, LCSW     12/02/2022    9:34 AM  Depression screen PHQ 2/9  Decreased Interest 0  Down,  Depressed, Hopeless 3  PHQ - 2 Score 3  Altered  sleeping 1  Tired, decreased energy 0  Change in appetite 0  Feeling bad or failure about yourself  0  Trouble concentrating 0  Moving slowly or fidgety/restless 0  Suicidal thoughts 0  PHQ-9 Score 4      12/02/2022    9:36 AM  GAD 7 : Generalized Anxiety Score  Nervous, Anxious, on Edge 0  Control/stop worrying 0  Worry too much - different things 0  Trouble relaxing 0  Restless 0  Easily annoyed or irritable 0  Afraid - awful might happen 0  Total GAD 7 Score 0

## 2022-12-03 NOTE — Discharge Instructions (Addendum)
You were seen in the emergency department for a headache.  Your labs were all reassuring without any obvious signs of preeclampsia.  You are given medications for your headache.  If your headache continues or worsens, please return to the emergency department for further evaluation.  Otherwise I would advise a follow-up with your OB/GYN for further evaluation.

## 2022-12-03 NOTE — ED Triage Notes (Signed)
Patient arrives ambulatory by POV c/o headaches since last week after having miscarriage. Worse if not eating and drinking.

## 2022-12-04 ENCOUNTER — Other Ambulatory Visit: Payer: Self-pay

## 2022-12-16 ENCOUNTER — Ambulatory Visit: Payer: MEDICAID | Admitting: Clinical

## 2022-12-16 DIAGNOSIS — F4321 Adjustment disorder with depressed mood: Secondary | ICD-10-CM

## 2022-12-16 NOTE — Patient Instructions (Signed)
[facility] [address] [place], [place] [address] [phone] (main office) [phone] (Kymberly Blomberg's office)  [facility]  [address], [place], [place] [address] [phone] or [phone] WALK-IN URGENT CARE 24/7 FOR ANYONE [address], ,   [phone] Fax: [phone] [email or web address] *Interpreters available *Accepts all insurance and uninsured for Urgent Care needs *Accepts Medicaid and uninsured for outpatient treatment (below)    ONLY FOR [place] Residents  Below:   Outpatient New Patient Assessment/Therapy Walk-ins:        Monday -Thursday 8am until slots are full.        Every Friday 1pm-4pm  (first come, first served)                   New Patient Psychiatry/Medication Management        Monday-Friday 8am-11am (first come, first served)              For all walk-ins we ask that you arrive by 7:15am, because patients will be seen in the order of arrival.     

## 2022-12-19 ENCOUNTER — Ambulatory Visit: Payer: Medicaid Other | Admitting: Obstetrics and Gynecology

## 2022-12-19 DIAGNOSIS — R110 Nausea: Secondary | ICD-10-CM

## 2022-12-19 DIAGNOSIS — R079 Chest pain, unspecified: Secondary | ICD-10-CM

## 2022-12-19 DIAGNOSIS — R519 Headache, unspecified: Secondary | ICD-10-CM

## 2022-12-19 DIAGNOSIS — O364XX0 Maternal care for intrauterine death, not applicable or unspecified: Secondary | ICD-10-CM

## 2022-12-19 NOTE — Progress Notes (Deleted)
    Post Partum Visit Note  Alyssa Woodard is a 27 y.o. G32P1101 female who presents for a postpartum visit. She is {1-10:13787} {time; units:18646} postpartum following a {method of delivery:313099}.  I have fully reviewed the prenatal and intrapartum course. The delivery was at *** gestational weeks.  Anesthesia: {anesthesia types:812}. Postpartum course has been ***. Baby is doing well***. Baby is feeding by {breastmilk/bottle:69}. Bleeding {vag bleed:12292}. Bowel function is {normal:32111}. Bladder function is {normal:32111}. Patient {is/is not:9024} sexually active. Contraception method is {contraceptive method:5051}. Postpartum depression screening: {gen negative/positive:315881}.   The pregnancy intention screening data noted above was reviewed. Potential methods of contraception were discussed. The patient elected to proceed with No data recorded.    Health Maintenance Due  Topic Date Due   Hepatitis C Screening  Never done   PAP-Cervical Cytology Screening  Never done   PAP SMEAR-Modifier  Never done   COVID-19 Vaccine (1 - 2023-24 season) Never done    {Common ambulatory SmartLinks:19316}  Review of Systems {ros; complete:30496}  Objective:  LMP  (LMP Unknown)    General:  {gen appearance:16600}   Breasts:  {desc; normal/abnormal/not indicated:14647}  Lungs: {lung exam:16931}  Heart:  {heart exam:5510}  Abdomen: {abdomen exam:16834}   Wound {Wound assessment:11097}  GU exam:  {desc; normal/abnormal/not indicated:14647}       Assessment:    1. IUFD at 20 weeks or more of gestation ***  2. Postpartum care and examination ***  3. Chest pain, unspecified type ***  4. Chronic nonintractable headache, unspecified headache type ***  5. Nausea ***   *** postpartum exam.   Plan:   Essential components of care per ACOG recommendations:  1.  Mood and well being: Patient with {gen negative/positive:315881} depression screening today. Reviewed local resources  for support.  - Patient tobacco use? {tobacco use:25506}  - hx of drug use? {yes/no:25505}    2. Infant care and feeding:  -Patient currently breastmilk feeding? {yes/no:25502}  -Social determinants of health (SDOH) reviewed in EPIC. No concerns***The following needs were identified***  3. Sexuality, contraception and birth spacing - Patient {DOES_DOES NFA:21308} want a pregnancy in the next year.  Desired family size is {NUMBER 1-10:22536} children.  - Reviewed reproductive life planning. Reviewed contraceptive methods based on pt preferences and effectiveness.  Patient desired {Upstream End Methods:24109} today.   - Discussed birth spacing of 18 months  4. Sleep and fatigue -Encouraged family/partner/community support of 4 hrs of uninterrupted sleep to help with mood and fatigue  5. Physical Recovery  - Discussed patients delivery and complications. She describes her labor as {description:25511} - Patient had a {CHL AMB DELIVERY:[phone]}. Patient had a {laceration:25518} laceration. Perineal healing reviewed. Patient expressed understanding - Patient has urinary incontinence? {yes/no:25515} - Patient {ACTION; IS/IS MVH:[id number]} safe to resume physical and sexual activity  6.  Health Maintenance - HM due items addressed {Yes or If no, why not?:20788} - Last pap smear No results found for: "DIAGPAP" Pap smear {done:10129} at today's visit.  -Breast Cancer screening indicated? {indicated:25516}  7. Chronic Disease/Pregnancy Condition follow up: {Follow up:25499}  - PCP follow up  Judd Gaudier, CMA [facility], [facility]

## 2023-02-03 ENCOUNTER — Encounter (HOSPITAL_COMMUNITY): Payer: Self-pay

## 2023-02-03 ENCOUNTER — Telehealth (HOSPITAL_COMMUNITY): Payer: Self-pay | Admitting: Psychiatry

## 2023-02-03 NOTE — Telephone Encounter (Signed)
Called and LVM stating to call our office back to confirm appt by 12 noon tomorrow. Otherwise appt would be cancelled

## 2023-02-04 NOTE — Progress Notes (Unsigned)
Psychiatric Initial Adult Assessment  Patient Identification: Alyssa Woodard MRN:  [id number] Date of Evaluation:  02/05/2023 Referral Source: Harvie Bridge MD  Assessment:  Alyssa Woodard is a 27 y.o. G56P1101 female with a history of learning disability, anxiety, depression, and recent pregnancy with late fetal demise who presents to [facility] via video conferencing for initial evaluation of anxiety and mood.  Patient experienced late pregnancy loss in early June 2024 and reports associated worry and rumination on these events as well as emotions of sadness, shock, and occasional guilt that appear to represent normal grief process. She does not appear to meet criteria for major depressive episode, pathologic anxiety, or PTSD at this time. She reports reminders of this loss bring about feelings of both sadness and comfort. She denies any disruption of sleep, PO intake, care of self or of her 27 year old son and identifies she continues to feel hopeful and engaged with others. Denies SI and no acute safety concerns. Psychoeducation provided on grief process and brief supportive therapy provided. Patient identifies having close support from her mother. She is currently on Zoloft managed by PCP for history of anxiety and depression; unable to ascertain history of clear depressive episodes although thorough assessment is difficult given patient's somewhat concrete thought process. She identifies significant benefit from Zoloft and continues to find it helpful for mood and anxiety. There does not appear to be acute indication for medication change today and she is amenable to continuing to receive Zoloft through PCP. She expressed desire for therapeutic support and already has appointment scheduled with psychotherapist in about 1 month. Will not schedule follow-up with this writer but encouraged patient to reach out if any worsening of symptoms or if she desires changes to  medications.   Of note: patient reports she may be moving out of [facility] in near future but has not yet obtained new housing. Discussed that this clinic is only able to serve [facility] residents and encouraged her to call clinic or discuss with therapist at next visit if she will in fact be moving.  Plan:  # Grief  Normal stress reaction Past medication trials: none Status of problem: new problem to this provider Interventions: -- Continue Zoloft 50 mg daily (rx by PCP; plan to continue obtaining through PCP) -- Patient scheduled for individual psychotherapy with Stephan Minister [facility] 03/03/23  Patient was given contact information for behavioral health clinic and was instructed to call 911 for emergencies.   Subjective:  Chief Complaint:  Chief Complaint  Patient presents with   Medication Management    History of Present Illness:    Chart review: -- Medical hospitalization 11/12/22-11/14/22 for intrauterine fetal demise at 36 wk; pregnancy course complicated by no prenatal care, intrapartum GHTN. -- Referral for "grief" in June 2024 -- Home medications: Zoloft 50 mg daily   Patient reports she had a stillbirth a few months ago and has been experiencing anxiety since that time. Went through a lot of emotions as she didn't know she was pregnant until the day she went to the hospital. States that after the stillbirth she initially blamed herself and felt really down; however feels this has gotten better with time. Continues to feel mixture of emotions including sadness and shock. Reports having baby's ashes in her living room and pictures on her phone that make her feel sad but also comforted.  Denies worrying every day but worries some days out of the week and often reflects on events  in the hospital. Denies overt flashbacks, hypervigilance or hyperarousal, or nightmares. Sleeping well with 7-8 hours nightly. When anxious, may try to distract herself by playing with her son  or watching TV and finds this helpful.  Reports feeling sad about losing her son although knows she needs to stay strong her for 62 yo son. May cry about this at times however feels her mood is able to recover. Reports good support at home; lives with mom, little brother, and her son (18 yo). Energy and motivation have been good. Reports normal appetite. Denies anhedonia and greatly enjoys spending time with her 27 yo.   Patient then began using the bathroom during the virtual visit - discussed expectations regarding etiquette on virtual visits and she put the phone to the side until finished using the restroom.   Resumed visit. She endorses history of anxiety and depression by PCP about a year ago; unable to recall events surrounding when these diagnoses were made. When asked about history of depression, describes as "worrying a lot."  Taking Zoloft 50 mg daily rx by PCP; has been on this medication for about a year. Has found this very helpful for mood and anxiety. Denies adverse effects; takes at night because it helps her remain calm and fall asleep. Denies hopelessness or passive/active SI; identifies 65 yo son as significant protective factor. Denies HI.   Has been in therapy in the past and found it helpful; interested in restarting therapy. Discussed scheduled appointment in September. States she wants to ensure she remains strong for her son and is working to obtain more independence. Hoping to move into her own place in the near future; likely in [place].   Discussed that this clinic can only serve [facility] residents and encouraged patient to call clinic or discuss with next provider if there are more concrete plans made in regards to moving.  Reports she enjoyed school; graduated high school. Required special education classes due to learning disability (remembers having trouble in reading). 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 Not  currently working as she is on SSI.   Diagnostic conceptualization discussed including psychoeducation on grief process. Brief therapeutic support and empathic listening provided. Given current benefit from Zoloft and primary desire to engage in therapy, she is amenable to continuing medication management through PCP and establishing with psychotherapist in the clinic.   Medical conditions: -- GERD -- gestational HTN   Past Psychiatric History:  Diagnoses: learning disability requiring special education services; depression; anxiety Medication trials: Zoloft Previous psychiatrist/therapist: denies Hospitalizations: denies Suicide attempts: denies SIB: denies Hx of violence towards others: denies Developmental: per chart review - mom reported patient was not delayed in speech or motor milestones Current access to guns: denies Hx of trauma/abuse: denies  Previous Psychotropic Medications: Yes   Substance Abuse History in the last 12 months:   -- Cannabis: last used prior to June 2024; previously using daily  -- Denies use of other illicit drugs  -- Etoh: denies; last use June 2024  -- Tobacco: stopped use of nicotine in June 2024  Past Medical History:  Past Medical History:  Diagnosis Date   Anxiety    Depression    GERD (gastroesophageal reflux disease)    Headache     Past Surgical History:  Procedure Laterality Date   CESAREAN SECTION N/A 12/02/2019   Procedure: CESAREAN SECTION;  Surgeon: Catalina Antigua, MD;  Location: [facility] LD ORS;  Service: Obstetrics;  Laterality: N/A;   CHOLECYSTECTOMY N/A 08/28/2021   Procedure: LAPAROSCOPIC CHOLECYSTECTOMY WITH INTRAOPERATIVE CHOLANGIOGRAM;  Surgeon: Violeta Gelinas, MD;  Location: [facility] OR;  Service: General;  Laterality: N/A;    Family Psychiatric History:  Brother: nonverbal ASD; learning disability  Family History:  Family History  Problem Relation Age of Onset   Hypertension Mother    Breast cancer Maternal Aunt    Colon  cancer Neg Hx    Esophageal cancer Neg Hx    Pancreatic cancer Neg Hx    Stomach cancer Neg Hx    Liver disease Neg Hx     Social History:   Academic/Vocational: graduated high school  Social History   Socioeconomic History   Marital status: Single    Spouse name: Not on file   Number of children: Not on file   Years of education: Not on file   Highest education level: Not on file  Occupational History   Not on file  Tobacco Use   Smoking status: Former    Types: Cigarettes   Smokeless tobacco: Never  Vaping Use   Vaping status: Never Used  Substance and Sexual Activity   Alcohol use: Not Currently    Comment: socially   Drug use: Not Currently    Frequency: 2.0 times per week    Types: Marijuana    Comment: previously smoking marijuana   Sexual activity: Not Currently    Comment: has nexplanon  Other Topics Concern   Not on file  Social History Narrative   Completed 12th grade. Not currently working; on SSI.    Social Determinants of Health   Financial Resource Strain: Not on file  Food Insecurity: No Food Insecurity (11/12/2022)   Hunger Vital Sign    Worried About Running Out of Food in the Last Year: Never true    Ran Out of Food in the Last Year: Never true  Transportation Needs: No Transportation Needs (11/12/2022)   PRAPARE - Administrator, Civil Service (Medical): No    Lack of Transportation (Non-Medical): No  Physical Activity: Not on file  Stress: Not on file  Social Connections: Not on file    Additional Social History: updated  Allergies:  No Known Allergies  Current Medications: Current Outpatient Medications  Medication Sig Dispense Refill   dicyclomine (BENTYL) 10 MG/5ML solution Take 10 mg by mouth 4 (four) times daily -  before meals and at bedtime.     ibuprofen (ADVIL) 600 MG tablet Take 1 tablet (600 mg total) by mouth every 6 (six) hours. 30 tablet 0   omeprazole (PRILOSEC) 40 MG capsule Take 1 capsule (40 mg total) by  mouth in the morning and at bedtime. 60 capsule 1   sertraline (ZOLOFT) 50 MG tablet Take 1 tablet (50 mg total) by mouth daily. (Patient taking differently: Take 50 mg by mouth at bedtime.) 30 tablet 11   acetaminophen (TYLENOL) 325 MG tablet Take 2 tablets (650 mg total) by mouth every 6 (six) hours as needed for mild pain. 100 tablet 0   No current facility-administered medications for this visit.    ROS: Denies any physical complaints  Objective:  Psychiatric Specialty Exam: There were no vitals taken for this visit.There is no height or weight on file to calculate BMI.  General Appearance: Casual and Fairly Groomed  Eye Contact:  Fair  Speech:  Clear and Coherent and Normal Rate  Volume:  Normal  Mood:   "worrying a lot"  Affect:   Euthymic; blunted  Thought Content:  Denies AVH; no overt delusional thought  content on interview    Suicidal Thoughts:  No  Homicidal Thoughts:  No  Thought Process:  Linear however concrete  Orientation:  Full (Time, Place, and Person)    Memory: Grossly intact  Judgment:  Good  Insight:  Fair  Concentration:  Concentration: Good  Recall:  not formally assessed  Fund of Knowledge: Fair  Language: Good  Psychomotor Activity:  Normal  Akathisia:  No  AIMS (if indicated): n/a  Assets:  Communication Skills Desire for Improvement Housing Leisure Time Physical Health Social Support  ADL's:  Intact  Cognition: WNL  Sleep:  Good   PE: General: sits comfortably in view of camera; no acute distress  Pulm: no increased work of breathing on room air  MSK: all extremity movements appear intact  Neuro: no focal neurological deficits observed  Gait & Station: unable to assess by video    Metabolic Disorder Labs: Lab Results  Component Value Date   HGBA1C 4.9 11/12/2022   MPG 94 11/12/2022   No results found for: "PROLACTIN" Lab Results  Component Value Date   CHOL 125 08/01/2021   TRIG 46 08/01/2021   HDL 37 (L) 08/01/2021    CHOLHDL 3.4 08/01/2021   LDLCALC 75 08/01/2021   Lab Results  Component Value Date   TSH 1.10 08/01/2021    Therapeutic Level Labs: No results found for: "LITHIUM" No results found for: "CBMZ" No results found for: "VALPROATE"  Screenings:  GAD-7    Flowsheet Row Integrated Behavioral Health [facility]  Total GAD-7 Score 0      PHQ2-9    Flowsheet Row Office Visit from 02/05/2023 in [facility] Integrated Behavioral Health from 12/02/2022 in [facility]  PHQ-2 Total Score 1 3  PHQ-9 Total Score -- 4      Flowsheet Row ED from 12/03/2022 in [facility] at [facility] ED to Hosp-Admission (Discharged) from 11/12/2022 in [place] [facility] Admission (Discharged) from 08/28/2021 in [facility]  SURGICAL  C-SSRS RISK CATEGORY No Risk No Risk No Risk       Collaboration of Care: Collaboration of Care: Primary Care Provider AEB managing mental health medications and Referral or follow-up with counselor/therapist AEB scheduled for individual psychotherapy  Patient/Guardian was advised Release of Information must be obtained prior to any record release in order to collaborate their care with an outside provider. Patient/Guardian was advised if they have not already done so to contact the registration department to sign all necessary forms in order for [place] to release information regarding their care.   Consent: Patient/Guardian gives verbal consent for treatment and assignment of benefits for services provided during this visit. Patient/Guardian expressed understanding and agreed to proceed.   Televisit via video: I connected with Zyanne N Jumper on 02/05/23 at  9:00 AM EDT by a video enabled telemedicine application and verified that I am speaking with the correct person using  two identifiers.  Location: Patient: home address in [place] Provider: remote clinic in [place]   I discussed the limitations of evaluation and management by telemedicine and the availability of in person appointments. The patient expressed understanding and agreed to proceed.  I discussed the assessment and treatment plan with the patient. The patient was provided an opportunity to ask questions and all were  answered. The patient agreed with the plan and demonstrated an understanding of the instructions.   The patient was advised to call back or seek an in-person evaluation if the symptoms worsen or if the condition fails to improve as anticipated.  I provided 50 minutes dedicated to the care of this patient via video on the date of this encounter to include chart review, face-to-face time with the patient, medication management/counseling, brief therapeutic support, and documentation.  Seerat Peaden A Ariba Lehnen 8/28/202410:03 AM

## 2023-02-05 ENCOUNTER — Encounter (HOSPITAL_COMMUNITY): Payer: Self-pay | Admitting: Psychiatry

## 2023-02-05 ENCOUNTER — Ambulatory Visit (HOSPITAL_COMMUNITY): Payer: MEDICAID | Admitting: Psychiatry

## 2023-02-05 DIAGNOSIS — F4321 Adjustment disorder with depressed mood: Secondary | ICD-10-CM | POA: Diagnosis not present

## 2023-02-05 NOTE — Patient Instructions (Signed)
Thank you for attending your appointment today.  -- We did not make any changes to your mental health medications today; continue Zoloft as prescribed by your primary care doctor.   Please do not make any changes to medications without first discussing with your provider. If you are experiencing a psychiatric emergency, please call 911 or present to your nearest emergency department. Additional crisis, medication management, and therapy resources are included below.  [facility]  [address], [place], [place] [address] [phone] WALK-IN URGENT CARE 24/7 FOR ANYONE [address], [place], [place]  [phone] Fax: [phone] [email or web address] *Interpreters available *Accepts all insurance and uninsured for Urgent Care needs *Accepts Medicaid and uninsured for outpatient treatment (below)      ONLY FOR [facility]  Below:    Outpatient New Patient Assessment/Therapy Walk-ins:        Monday -Thursday 8am until slots are full.        Every Friday 1pm-4pm  (first come, first served)                   New Patient Psychiatry/Medication Management        Monday-Friday 8am-11am (first come, first served)               For all walk-ins we ask that you arrive by 7:15am, because patients will be seen in the order of arrival.

## 2023-03-03 ENCOUNTER — Ambulatory Visit (HOSPITAL_COMMUNITY): Payer: MEDICAID | Admitting: Mental Health

## 2023-07-21 ENCOUNTER — Ambulatory Visit: Payer: MEDICAID

## 2023-08-01 ENCOUNTER — Ambulatory Visit: Payer: MEDICAID

## 2023-10-07 NOTE — Progress Notes (Signed)
 ANNUAL EXAM Patient name: Alyssa Woodard MRN [id number]  Date of birth: 09-21-1995 Chief Complaint:   No chief complaint on file.  History of Present Illness:   Alyssa Woodard is a 28 y.o. G85P1101 {race:25618} female being seen today for a routine annual exam.  Current complaints: ***  No LMP recorded.   The pregnancy intention screening data noted above was reviewed. Potential methods of contraception were discussed. The patient elected to proceed with No data recorded.   Last pap ***. Results were: {Pap findings:25134}. H/O abnormal pap: {yes/yes***/no:23866} Last mammogram: ***. Results were: {normal, abnormal, n/a:23837}. Family h/o breast cancer: yes maternal aunt Last colonoscopy: ***. Results were: {normal, abnormal, n/a:23837}. Family h/o colorectal cancer: {yes***/no:23838}     02/05/2023    9:31 AM 12/02/2022    9:34 AM  Depression screen PHQ 2/9  Decreased Interest  0  Down, Depressed, Hopeless  3  PHQ - 2 Score  3  Altered sleeping  1  Tired, decreased energy  0  Change in appetite  0  Feeling bad or failure about yourself   0  Trouble concentrating  0  Moving slowly or fidgety/restless  0  Suicidal thoughts  0  PHQ-9 Score  4     Information is confidential and restricted. Go to Review Flowsheets to unlock data.        12/02/2022    9:36 AM  GAD 7 : Generalized Anxiety Score  Nervous, Anxious, on Edge 0  Control/stop worrying 0  Worry too much - different things 0  Trouble relaxing 0  Restless 0  Easily annoyed or irritable 0  Afraid - awful might happen 0  Total GAD 7 Score 0     Review of Systems:   Pertinent items are noted in HPI Denies any headaches, blurred vision, fatigue, shortness of breath, chest pain, abdominal pain, abnormal vaginal discharge/itching/odor/irritation, problems with periods, bowel movements, urination, or intercourse unless otherwise stated above. Pertinent History Reviewed:  Reviewed past medical,surgical, social and  family history.  Reviewed problem list, medications and allergies. Physical Assessment:  There were no vitals filed for this visit.There is no height or weight on file to calculate BMI.        Physical Examination:   General appearance - well appearing, and in no distress  Mental status - alert, oriented to person, place, and time  Psych:  She has a normal mood and affect  Skin - warm and dry, normal color, no suspicious lesions noted  Chest - effort normal, all lung fields clear to auscultation bilaterally  Heart - normal rate and regular rhythm  Neck:  midline trachea, no thyromegaly or nodules  Breasts - breasts appear normal, no suspicious masses, no skin or nipple changes or  axillary nodes  Abdomen - soft, nontender, nondistended, no masses or organomegaly  Pelvic - VULVA: normal appearing vulva with no masses, tenderness or lesions  VAGINA: normal appearing vagina with normal color and discharge, no lesions  CERVIX: normal appearing cervix without discharge or lesions, no CMT  Thin prep pap is {Desc; done/not:10129} *** HR HPV cotesting  UTERUS: uterus is felt to be normal size, shape, consistency and nontender   ADNEXA: No adnexal masses or tenderness noted.  Extremities:  No swelling or varicosities noted  Chaperone present for exam  No results found for this or any previous visit (from the past 24 hours).  Assessment & Plan:  - Cervical cancer screening: Discussed screening Q3 years. Reviewed importance of annual exams  and limits of pap smear. Pap with reflex HPV *** - GC/CT: Discussed and recommended. Pt  {Blank single:19197::"accepts","declines"} - Gardasil: {Blank single:19197::"***","has not yet had. Will provide information","completed","has not yet had. Counseling provided and she declines","Has not yet had. Counseling provided and pt accepts"} - Birth Control: {Birth control type:23956} - Breast Health: Encouraged self breast awareness/exams. Teaching provided. -  Mammogram: {Mammo f/u:25212::"@ 28yo"}, or sooner if problems - Colonoscopy: {TCS f/u:25213::"@ 28yo"}, or sooner if problems - Follow-up: 12 months and prn     No orders of the defined types were placed in this encounter.   Meds: No orders of the defined types were placed in this encounter.   Follow-up: No follow-ups on file.  Kaelea Gathright E Bryanda Mikel, PA-C 10/07/2023 10:15 AM

## 2023-10-08 ENCOUNTER — Encounter: Payer: Self-pay | Admitting: Physician Assistant

## 2023-10-08 ENCOUNTER — Ambulatory Visit (INDEPENDENT_AMBULATORY_CARE_PROVIDER_SITE_OTHER): Payer: MEDICAID | Admitting: Physician Assistant

## 2023-10-08 ENCOUNTER — Other Ambulatory Visit (HOSPITAL_COMMUNITY)
Admission: RE | Admit: 2023-10-08 | Discharge: 2023-10-08 | Disposition: A | Discharge status: Home / Self Care | Payer: MEDICAID | Source: Ambulatory Visit | Attending: Physician Assistant | Admitting: Physician Assistant

## 2023-10-08 VITALS — BP 125/86 | HR 87 | Ht 63.0 in | Wt 377.1 lb

## 2023-10-08 DIAGNOSIS — N939 Abnormal uterine and vaginal bleeding, unspecified: Secondary | ICD-10-CM

## 2023-10-08 DIAGNOSIS — Z1339 Encounter for screening examination for other mental health and behavioral disorders: Secondary | ICD-10-CM | POA: Diagnosis not present

## 2023-10-08 DIAGNOSIS — Z01419 Encounter for gynecological examination (general) (routine) without abnormal findings: Secondary | ICD-10-CM

## 2023-10-08 DIAGNOSIS — Z124 Encounter for screening for malignant neoplasm of cervix: Secondary | ICD-10-CM | POA: Diagnosis not present

## 2023-10-08 NOTE — Progress Notes (Signed)
 Pt has concerns for heavy bleeding with cramping.

## 2023-10-09 ENCOUNTER — Encounter: Payer: Self-pay | Admitting: Physician Assistant

## 2023-10-09 LAB — CERVICOVAGINAL ANCILLARY ONLY
Chlamydia: NEGATIVE
Comment: NEGATIVE
Comment: NEGATIVE
Comment: NORMAL
Neisseria Gonorrhea: NEGATIVE
Trichomonas: POSITIVE — AB

## 2023-10-09 MED ORDER — IBUPROFEN 400 MG PO TABS
400.0000 mg | ORAL_TABLET | Freq: Four times a day (QID) | ORAL | 0 refills | Status: AC | PRN
Start: 2023-10-09 — End: ?

## 2023-10-10 LAB — CYTOLOGY - PAP
Adequacy: ABSENT
Comment: NEGATIVE
Diagnosis: NEGATIVE
High risk HPV: NEGATIVE

## 2023-10-13 ENCOUNTER — Ambulatory Visit (HOSPITAL_BASED_OUTPATIENT_CLINIC_OR_DEPARTMENT_OTHER)
Admission: RE | Admit: 2023-10-13 | Discharge: 2023-10-13 | Disposition: A | Discharge status: Home / Self Care | Payer: MEDICAID | Source: Ambulatory Visit | Attending: Physician Assistant | Admitting: Physician Assistant

## 2023-10-13 ENCOUNTER — Other Ambulatory Visit: Payer: Self-pay

## 2023-10-13 DIAGNOSIS — N939 Abnormal uterine and vaginal bleeding, unspecified: Secondary | ICD-10-CM | POA: Insufficient documentation

## 2023-10-13 MED ORDER — METRONIDAZOLE 500 MG PO TABS: 500.0000 mg | ORAL_TABLET | Freq: Two times a day (BID) | ORAL | 0 refills | Status: AC

## 2023-10-13 NOTE — Progress Notes (Signed)
 Rx flagyl  sent per protocol for trich

## 2023-10-17 ENCOUNTER — Encounter: Payer: Self-pay | Admitting: Physician Assistant

## 2023-10-22 ENCOUNTER — Telehealth: Payer: Self-pay | Admitting: Licensed Clinical Social Worker

## 2023-10-22 NOTE — Telephone Encounter (Signed)
 BH appointment scheduled for 10/23/23 at 9:15am

## 2023-10-23 ENCOUNTER — Ambulatory Visit: Payer: Self-pay | Admitting: Licensed Clinical Social Worker

## 2023-10-23 NOTE — BH Specialist Note (Signed)
 Virtual link sent to patient on this date. [facility] contacted patient and was unable to leave a VM at this time. [facility] stayed connected for 20 and disconnected afterwards. Patient no showed for this visit.

## 2023-10-27 ENCOUNTER — Other Ambulatory Visit: Payer: MEDICAID

## 2023-11-04 ENCOUNTER — Other Ambulatory Visit: Payer: MEDICAID

## 2024-01-06 ENCOUNTER — Ambulatory Visit: Payer: MEDICAID
